# Patient Record
Sex: Female | Born: 2000 | Hispanic: No | Marital: Married | Smoking: Never smoker
Health system: Southern US, Community
[De-identification: ages and names within clinical notes are randomized; demographics above are authoritative.]

## PROBLEM LIST (undated history)

## (undated) DIAGNOSIS — N393 Stress incontinence (female) (male): Secondary | ICD-10-CM

## (undated) DIAGNOSIS — R519 Headache, unspecified: Secondary | ICD-10-CM

## (undated) DIAGNOSIS — J329 Chronic sinusitis, unspecified: Secondary | ICD-10-CM

## (undated) DIAGNOSIS — L821 Other seborrheic keratosis: Secondary | ICD-10-CM

## (undated) DIAGNOSIS — G8929 Other chronic pain: Secondary | ICD-10-CM

## (undated) DIAGNOSIS — L309 Dermatitis, unspecified: Secondary | ICD-10-CM

## (undated) DIAGNOSIS — R45851 Suicidal ideations: Secondary | ICD-10-CM

## (undated) DIAGNOSIS — F32A Depression, unspecified: Secondary | ICD-10-CM

## (undated) HISTORY — DX: Stress incontinence (female) (male): N39.3

## (undated) HISTORY — DX: Other seborrheic keratosis: L82.1

## (undated) HISTORY — DX: Depression, unspecified: F32.A

## (undated) HISTORY — DX: Suicidal ideations: R45.851

## (undated) HISTORY — DX: Other chronic pain: G89.29

## (undated) HISTORY — PX: NO PAST SURGERIES: SHX2092

## (undated) HISTORY — DX: Headache, unspecified: R51.9

---

## 2020-03-24 ENCOUNTER — Inpatient Hospital Stay (HOSPITAL_COMMUNITY): Payer: Medicaid Other

## 2020-03-24 ENCOUNTER — Inpatient Hospital Stay (HOSPITAL_COMMUNITY)
Admission: EM | Admit: 2020-03-24 | Discharge: 2020-03-24 | Disposition: A | Discharge status: Home / Self Care | Payer: Medicaid Other | Attending: Family Medicine | Admitting: Family Medicine

## 2020-03-24 ENCOUNTER — Other Ambulatory Visit: Payer: Self-pay

## 2020-03-24 ENCOUNTER — Encounter (HOSPITAL_COMMUNITY): Payer: Self-pay | Admitting: Family Medicine

## 2020-03-24 DIAGNOSIS — R102 Pelvic and perineal pain: Secondary | ICD-10-CM | POA: Insufficient documentation

## 2020-03-24 DIAGNOSIS — O26899 Other specified pregnancy related conditions, unspecified trimester: Secondary | ICD-10-CM

## 2020-03-24 DIAGNOSIS — O26891 Other specified pregnancy related conditions, first trimester: Secondary | ICD-10-CM

## 2020-03-24 DIAGNOSIS — Z3A01 Less than 8 weeks gestation of pregnancy: Secondary | ICD-10-CM

## 2020-03-24 DIAGNOSIS — O4691 Antepartum hemorrhage, unspecified, first trimester: Secondary | ICD-10-CM

## 2020-03-24 DIAGNOSIS — O3680X0 Pregnancy with inconclusive fetal viability, not applicable or unspecified: Secondary | ICD-10-CM

## 2020-03-24 DIAGNOSIS — O209 Hemorrhage in early pregnancy, unspecified: Secondary | ICD-10-CM | POA: Insufficient documentation

## 2020-03-24 HISTORY — DX: Chronic sinusitis, unspecified: J32.9

## 2020-03-24 HISTORY — DX: Dermatitis, unspecified: L30.9

## 2020-03-24 LAB — CBC
HCT: 40 % (ref 36.0–46.0)
Hemoglobin: 13.2 g/dL (ref 12.0–15.0)
MCH: 28 pg (ref 26.0–34.0)
MCHC: 33 g/dL (ref 30.0–36.0)
MCV: 84.7 fL (ref 80.0–100.0)
Platelets: 313 10*3/uL (ref 150–400)
RBC: 4.72 MIL/uL (ref 3.87–5.11)
RDW: 12.9 % (ref 11.5–15.5)
WBC: 5.8 10*3/uL (ref 4.0–10.5)
nRBC: 0 % (ref 0.0–0.2)

## 2020-03-24 LAB — URINALYSIS, ROUTINE W REFLEX MICROSCOPIC
Bilirubin Urine: NEGATIVE
Glucose, UA: NEGATIVE mg/dL
Ketones, ur: NEGATIVE mg/dL
Nitrite: NEGATIVE
Protein, ur: 100 mg/dL — AB
RBC / HPF: >50 RBC/hpf — ABNORMAL HIGH (ref 0–5)
Specific Gravity, Urine: 1.021 (ref 1.005–1.030)
pH: 6 (ref 5.0–8.0)

## 2020-03-24 LAB — WET PREP, GENITAL
Clue Cells Wet Prep HPF POC: NONE SEEN
Sperm: NONE SEEN
Trich, Wet Prep: NONE SEEN
Yeast Wet Prep HPF POC: NONE SEEN

## 2020-03-24 LAB — HCG, QUANTITATIVE, PREGNANCY: hCG, Beta Chain, Quant, S: 17870 m[IU]/mL — ABNORMAL HIGH (ref <5)

## 2020-03-24 LAB — ABO/RH: ABO/RH(D): O POS

## 2020-03-24 LAB — POCT PREGNANCY, URINE: Preg Test, Ur: POSITIVE — AB

## 2020-03-24 LAB — HIV ANTIBODY (ROUTINE TESTING W REFLEX): HIV Screen 4th Generation wRfx: NONREACTIVE

## 2020-03-24 NOTE — ED Notes (Signed)
MAU called and PA notified to assess patient.

## 2020-03-24 NOTE — ED Triage Notes (Signed)
Pt came in POV with c/o of cramping and vaginal bleeding. Pt is [redacted]week pregnant.

## 2020-03-24 NOTE — MAU Note (Signed)
Patient unable to void at this time for UPT.  Patient states she will alert staff when able to leave sample.  Labs drawn by phlebotomy.

## 2020-03-24 NOTE — Discharge Instructions (Signed)
Threatened Miscarriage  A threatened miscarriage occurs when a woman has vaginal bleeding during the first 20 weeks of pregnancy but the pregnancy has not ended. If you have vaginal bleeding during this time, your health care provider will do tests to make sure you are still pregnant. If the tests show that you are still pregnant and that the developing baby (fetus) inside your uterus is still growing, your condition is considered a threatened miscarriage. A threatened miscarriage does not mean your pregnancy will end, but it does increase the risk of losing your pregnancy (complete miscarriage). What are the causes? The cause of this condition is usually not known. For women who go on to have a complete miscarriage, the most common cause is an abnormal number of chromosomes in the developing baby. Chromosomes are the structures inside cells that hold all of a person's genetic material. What increases the risk? The following lifestyle factors may increase your risk of a miscarriage in early pregnancy:  Smoking.  Drinking excessive amounts of alcohol or caffeine.  Recreational drug use. The following preexisting health conditions may increase your risk of a miscarriage in early pregnancy:  Polycystic ovary syndrome.  Uterine fibroids.  Infections.  Diabetes mellitus. What are the signs or symptoms? Symptoms of this condition include:  Vaginal bleeding.  Mild abdominal pain or cramps. How is this diagnosed? If you have bleeding with or without abdominal pain before 20 weeks of pregnancy, your health care provider will do tests to check whether you are still pregnant. These will include:  Ultrasound. This test uses sound waves to create images of the inside of your uterus. This allows your health care provider to look at your developing baby and other structures, such as your placenta.  Pelvic exam. This is an internal exam of your vagina and cervix.  Measurement of your baby's heart  rate.  Laboratory tests such as blood tests, urine tests, or swabs for infection You may be diagnosed with a threatened miscarriage if:  Ultrasound testing shows that you are still pregnant.  Your baby's heart rate is strong.  A pelvic exam shows that the opening between your uterus and your vagina (cervix) is closed.  Blood tests confirm that you are still pregnant. How is this treated? No treatments have been shown to prevent a threatened miscarriage from going on to a complete miscarriage. However, the right home care is important. Follow these instructions at home:  Get plenty of rest.  Do not have sex or use tampons if you have vaginal bleeding.  Do not douche.  Do not smoke or use recreational drugs.  Do not drink alcohol.  Avoid caffeine.  Keep all follow-up prenatal visits as told by your health care provider. This is important. Contact a health care provider if:  You have light vaginal bleeding or spotting while pregnant.  You have abdominal pain or cramping.  You have a fever. Get help right away if:  You have heavy vaginal bleeding.  You have blood clots coming from your vagina.  You pass tissue from your vagina.  You leak fluid, or you have a gush of fluid from your vagina.  You have severe low back pain or abdominal cramps.  You have fever, chills, and severe abdominal pain. Summary  A threatened miscarriage occurs when a woman has vaginal bleeding during the first 20 weeks of pregnancy but the pregnancy has not ended.  The cause of a threatened miscarriage is usually not known.  Symptoms of this condition may   include vaginal bleeding and mild abdominal pain or cramps.  No treatments have been shown to prevent a threatened miscarriage from going on to a complete miscarriage.  Keep all follow-up prenatal visits as told by your health care provider. This is important. This information is not intended to replace advice given to you by your health  care provider. Make sure you discuss any questions you have with your health care provider. Document Revised: 01/18/2018 Document Reviewed: 03/10/2017 Elsevier Patient Education  2020 Elsevier Inc.  

## 2020-03-24 NOTE — MAU Provider Note (Signed)
Chief Complaint: Abdominal Pain and Vaginal Bleeding   First Provider Initiated Contact with Patient 03/24/20 2153        SUBJECTIVE HPI: Gloria Orr is a 19 y.o. G1P0 at [redacted]w[redacted]d by LMP who presents to maternity admissions reporting vaginal bleeding and pelvic pain. Bleeding was light over the past week and was heavy this am (now lighter).   [facility]  . She denies vaginal bleeding, vaginal itching/burning, urinary symptoms, h/a, dizziness, n/v, or fever/chills.    Abdominal Pain This is a new problem. The current episode started in the past 7 days. The onset quality is gradual. The problem occurs intermittently. The problem has been unchanged. The pain is located in the suprapubic region, LLQ and RLQ. The pain is mild. The quality of the pain is cramping. The abdominal pain does not radiate. Pertinent negatives include no constipation, diarrhea, dysuria, fever, frequency, myalgias, nausea or vomiting. Nothing aggravates the pain. The pain is relieved by nothing. She has tried nothing for the symptoms.  Vaginal Bleeding The patient's primary symptoms include pelvic pain and vaginal bleeding. The patient's pertinent negatives include no genital itching, genital lesions or genital odor. This is a new problem. The current episode started in the past 7 days. The problem occurs intermittently. The problem has been unchanged. The pain is mild. She is pregnant. Associated symptoms include abdominal pain. Pertinent negatives include no back pain, constipation, diarrhea, dysuria, fever, frequency, nausea or vomiting. The vaginal discharge was bloody. The vaginal bleeding is lighter than menses. She has not been passing clots. She has not been passing tissue. Nothing aggravates the symptoms. She has tried nothing for the symptoms.   RN Note: Patient sent from the ED for reports of pregnancy with vaginal bleeding and abdominal pain.  She states the pain started first then  started having some bleeding that was light at first but has since become heavier.  No clots.  First PNA scheduled for Thursday  Past Medical History:  Diagnosis Date  . Eczema   . Sinusitis    Past Surgical History:  Procedure Laterality Date  . NO PAST SURGERIES     Social History   Socioeconomic History  . Marital status: Married    Spouse name: Not on file  . Number of children: Not on file  . Years of education: Not on file  . Highest education level: Not on file  Occupational History  . Not on file  Tobacco Use  . Smoking status: Not on file  Substance and Sexual Activity  . Alcohol use: Not on file  . Drug use: Not on file  . Sexual activity: Yes  Other Topics Concern  . Not on file  Social History Narrative  . Not on file   Social Determinants of Health   Financial Resource Strain:   . Difficulty of Paying Living Expenses:   Food Insecurity:   . Worried About Programme researcher, broadcasting/film/video in the Last Year:   . Barista in the Last Year:   Transportation Needs:   . Freight forwarder (Medical):   Marland Kitchen Lack of Transportation (Non-Medical):   Physical Activity:   . Days of Exercise per Week:   . Minutes of Exercise per Session:   Stress:   . Feeling of Stress :   Social Connections:   . Frequency of Communication with Friends and Family:   . Frequency of Social Gatherings with Friends and Family:   . Attends Religious  Services:   . Active Member of Clubs or Organizations:   . Attends Archivist Meetings:   Marland Kitchen Marital Status:   Intimate Partner Violence:   . Fear of Current or Ex-Partner:   . Emotionally Abused:   Marland Kitchen Physically Abused:   . Sexually Abused:    No current facility-administered medications on file prior to encounter.   No current outpatient medications on file prior to encounter.   No Known Allergies  I have reviewed patient's Past Medical Hx, Surgical Hx, Family Hx, Social Hx, medications and allergies.   ROS:  Review of  Systems  Constitutional: Negative for fever.  Gastrointestinal: Positive for abdominal pain. Negative for constipation, diarrhea, nausea and vomiting.  Genitourinary: Positive for pelvic pain and vaginal bleeding. Negative for dysuria and frequency.  Musculoskeletal: Negative for back pain and myalgias.   Review of Systems  Other systems negative   Physical Exam  Physical Exam Patient Vitals for the past 24 hrs:  BP Temp Temp src Pulse Resp SpO2 Height Weight  03/24/20 2120 125/77 98.4 F (36.9 C) - 83 17 - - -  03/24/20 2058 - - - - - - - 55.8 kg  03/24/20 1958 - - - - - - 5\' 2"  (1.575 m) 55 kg  03/24/20 1955 (!) 142/71 98.3 F (36.8 C) Oral 74 16 100 % - -   Constitutional: Well-developed, well-nourished female in no acute distress.  Cardiovascular: normal rate Respiratory: normal effort GI: Abd soft, non-tender. Pos BS x 4 MS: Extremities nontender, no edema, normal ROM Neurologic: Alert and oriented x 4.  GU: Neg CVAT.  PELVIC EXAM: Cervix pink, visually closed, without lesion, scant red discharge, vaginal walls and external genitalia normal  LAB RESULTS Results for orders placed or performed during the hospital encounter of 03/24/20 (from the past 24 hour(s))  CBC     Status: None   Collection Time: 03/24/20  8:53 PM  Result Value Ref Range   WBC 5.8 4.0 - 10.5 K/uL   RBC 4.72 3.87 - 5.11 MIL/uL   Hemoglobin 13.2 12.0 - 15.0 g/dL   HCT 40.0 36.0 - 46.0 %   MCV 84.7 80.0 - 100.0 fL   MCH 28.0 26.0 - 34.0 pg   MCHC 33.0 30.0 - 36.0 g/dL   RDW 12.9 11.5 - 15.5 %   Platelets 313 150 - 400 K/uL   nRBC 0.0 0.0 - 0.2 %  ABO/Rh     Status: None   Collection Time: 03/24/20  8:53 PM  Result Value Ref Range   ABO/RH(D) O POS    No rh immune globuloin      NOT A RH IMMUNE GLOBULIN CANDIDATE, PT RH POSITIVE Performed at [facility], [place] [address], [place], [place] [address]   hCG, quantitative, pregnancy     Status: Abnormal   Collection Time: 03/24/20  8:53 PM   Result Value Ref Range   hCG, Beta Chain, Quant, S 17,870 (H) <5 mIU/mL  HIV Antibody (routine testing w rflx)     Status: None   Collection Time: 03/24/20  8:53 PM  Result Value Ref Range   HIV Screen 4th Generation wRfx NON REACTIVE NON REACTIVE  Pregnancy, urine POC     Status: Abnormal   Collection Time: 03/24/20  9:11 PM  Result Value Ref Range   Preg Test, Ur POSITIVE (A) NEGATIVE  Urinalysis, Routine w reflex microscopic     Status: Abnormal   Collection Time: 03/24/20  9:12 PM  Result Value Ref Range   Color, Urine YELLOW YELLOW   APPearance CLOUDY (A) CLEAR   Specific Gravity, Urine 1.021 1.005 - 1.030   pH 6.0 5.0 - 8.0   Glucose, UA NEGATIVE NEGATIVE mg/dL   Hgb urine dipstick LARGE (A) NEGATIVE   Bilirubin Urine NEGATIVE NEGATIVE   Ketones, ur NEGATIVE NEGATIVE mg/dL   Protein, ur 505 (A) NEGATIVE mg/dL   Nitrite NEGATIVE NEGATIVE   Leukocytes,Ua SMALL (A) NEGATIVE   RBC / HPF >50 (H) 0 - 5 RBC/hpf   WBC, UA 11-20 0 - 5 WBC/hpf   Bacteria, UA FEW (A) NONE SEEN   Squamous Epithelial / LPF 6-10 0 - 5   WBC Clumps PRESENT    Mucus PRESENT   Wet prep, genital     Status: Abnormal   Collection Time: 03/24/20 10:15 PM  Result Value Ref Range   Yeast Wet Prep HPF POC NONE SEEN NONE SEEN   Trich, Wet Prep NONE SEEN NONE SEEN   Clue Cells Wet Prep HPF POC NONE SEEN NONE SEEN   WBC, Wet Prep HPF POC MANY (A) NONE SEEN   Sperm NONE SEEN     --/--/O POS (03/30 2053)  IMAGING US OB Comp Less 14 Wks  Result Date: 03/24/2020 CLINICAL DATA:  Pelvic pain and vaginal bleeding with positive pregnancy test EXAM: OBSTETRIC <14 WK [place] AND TRANSVAGINAL OB US TECHNIQUE: Both transabdominal and transvaginal ultrasound examinations were performed for complete evaluation of the gestation as well as the maternal uterus, adnexal regions, and pelvic cul-de-sac. Transvaginal technique was performed to assess early pregnancy. COMPARISON:  None. FINDINGS: Intrauterine gestational sac:  Present Yolk sac:  Present Embryo:  Absent MSD: 11.6 mm   5 w   6 d Subchorionic hemorrhage:  None visualized. Maternal uterus/adnexae: Within normal limits. Minimal free fluid is noted in the pelvic cul-de-sac. IMPRESSION: Probable early intrauterine gestational sac with yolk sac, but no fetal pole, or cardiac activity yet visualized. Recommend follow-up quantitative B-HCG levels and follow-up US in 14 days to assess viability. This recommendation follows SRU consensus guidelines: Diagnostic Criteria for Nonviable Pregnancy Early in the First Trimester. Malva Limes Med 2013; 397:6734-19. No acute abnormality noted. Electronically Signed   By: Alcide Clever M.D.   On: 03/24/2020 22:40   US OB Transvaginal  Result Date: 03/24/2020 CLINICAL DATA:  Pelvic pain and vaginal bleeding with positive pregnancy test EXAM: OBSTETRIC <14 WK [place] AND TRANSVAGINAL OB US TECHNIQUE: Both transabdominal and transvaginal ultrasound examinations were performed for complete evaluation of the gestation as well as the maternal uterus, adnexal regions, and pelvic cul-de-sac. Transvaginal technique was performed to assess early pregnancy. COMPARISON:  None. FINDINGS: Intrauterine gestational sac: Present Yolk sac:  Present Embryo:  Absent MSD: 11.6 mm   5 w   6 d Subchorionic hemorrhage:  None visualized. Maternal uterus/adnexae: Within normal limits. Minimal free fluid is noted in the pelvic cul-de-sac. IMPRESSION: Probable early intrauterine gestational sac with yolk sac, but no fetal pole, or cardiac activity yet visualized. Recommend follow-up quantitative B-HCG levels and follow-up US in 14 days to assess viability. This recommendation follows SRU consensus guidelines: Diagnostic Criteria for Nonviable Pregnancy Early in the First Trimester. Malva Limes Med 2013; 379:0240-97. No acute abnormality noted. Electronically Signed   By: Alcide Clever M.D.   On: 03/24/2020 22:40   MAU Management/MDM: Ordered usual first trimester r/o ectopic  labs.   Pelvic exam and cultures done Will check baseline Ultrasound to rule out ectopic.  This bleeding/pain can represent a normal pregnancy with bleeding, spontaneous abortion or even an ectopic which can be life-threatening.  The process as listed above helps to determine which of these is present.  Reviewed findings effectively rule out ectopic but we cannot speak to viability just yet Recommend repeat US in 7-10 days SAB precautions  ASSESSMENT Pregnancy at [redacted]w[redacted]d by LMP Pelvic pain in pregnancy Bleeding in pregnancy  PLAN Discharge home Will repeat  Ultrasound in about 7-10 days  Ectopic precautions  Pt stable at time of discharge. Encouraged to return here or to other Urgent Care/ED if she develops worsening of symptoms, increase in pain, fever, or other concerning symptoms.    Wynelle Bourgeois CNM, MSN Certified Nurse-Midwife 03/24/2020  9:53 PM

## 2020-03-24 NOTE — MAU Note (Signed)
Patient sent from the ED for reports of pregnancy with vaginal bleeding and abdominal pain.  She states the pain started first then started having some bleeding that was light at first but has since become heavier.  No clots.  First PNA scheduled for Thursday.

## 2020-03-26 LAB — GC/CHLAMYDIA PROBE AMP (~~LOC~~) NOT AT ARMC
Chlamydia: NEGATIVE
Comment: NEGATIVE
Comment: NORMAL
Neisseria Gonorrhea: NEGATIVE

## 2020-03-30 ENCOUNTER — Ambulatory Visit (INDEPENDENT_AMBULATORY_CARE_PROVIDER_SITE_OTHER): Payer: Self-pay

## 2020-03-30 ENCOUNTER — Other Ambulatory Visit: Payer: Self-pay

## 2020-03-30 ENCOUNTER — Ambulatory Visit (HOSPITAL_COMMUNITY)
Admission: RE | Admit: 2020-03-30 | Discharge: 2020-03-30 | Disposition: A | Discharge status: Home / Self Care | Payer: Medicaid Other | Source: Ambulatory Visit | Attending: Advanced Practice Midwife | Admitting: Advanced Practice Midwife

## 2020-03-30 DIAGNOSIS — O3680X0 Pregnancy with inconclusive fetal viability, not applicable or unspecified: Secondary | ICD-10-CM | POA: Insufficient documentation

## 2020-03-30 DIAGNOSIS — O209 Hemorrhage in early pregnancy, unspecified: Secondary | ICD-10-CM | POA: Diagnosis present

## 2020-03-30 DIAGNOSIS — R102 Pelvic and perineal pain: Secondary | ICD-10-CM | POA: Insufficient documentation

## 2020-03-30 DIAGNOSIS — O368390 Maternal care for abnormalities of the fetal heart rate or rhythm, unspecified trimester, not applicable or unspecified: Secondary | ICD-10-CM

## 2020-03-30 DIAGNOSIS — O26899 Other specified pregnancy related conditions, unspecified trimester: Secondary | ICD-10-CM | POA: Insufficient documentation

## 2020-03-30 NOTE — Progress Notes (Signed)
Pt here today for OB US results r/t pelvic pain and vaginal bleeding.  Notified Dr. Alysia Penna pt's [place] results which shows fetal bradycardia.  Provider recommendation to schedule f/u OB US in 10-14 days to f/u FHR.  Notified pt provider's recommendation and [place] appt 04/13/20 @ 0900.  I also advised pt that she will come over to the office for results as she did today for results.  Pt verbalized understanding with no further questions.   Addison Naegeli, RN 03/30/20

## 2020-03-31 NOTE — Progress Notes (Signed)
Agree with A & P. 

## 2020-04-05 ENCOUNTER — Other Ambulatory Visit: Payer: Self-pay

## 2020-04-05 ENCOUNTER — Inpatient Hospital Stay (HOSPITAL_COMMUNITY)
Admission: AD | Admit: 2020-04-05 | Discharge: 2020-04-05 | Disposition: A | Discharge status: Home / Self Care | Payer: Medicaid Other | Attending: Obstetrics & Gynecology | Admitting: Obstetrics & Gynecology

## 2020-04-05 ENCOUNTER — Ambulatory Visit (HOSPITAL_COMMUNITY)
Admission: EM | Admit: 2020-04-05 | Discharge: 2020-04-05 | Disposition: A | Discharge status: Home / Self Care | Payer: Self-pay

## 2020-04-05 ENCOUNTER — Encounter (HOSPITAL_COMMUNITY): Payer: Self-pay | Admitting: Obstetrics & Gynecology

## 2020-04-05 ENCOUNTER — Inpatient Hospital Stay (HOSPITAL_COMMUNITY): Payer: Medicaid Other

## 2020-04-05 DIAGNOSIS — N939 Abnormal uterine and vaginal bleeding, unspecified: Secondary | ICD-10-CM

## 2020-04-05 DIAGNOSIS — O208 Other hemorrhage in early pregnancy: Secondary | ICD-10-CM | POA: Insufficient documentation

## 2020-04-05 DIAGNOSIS — O468X1 Other antepartum hemorrhage, first trimester: Secondary | ICD-10-CM

## 2020-04-05 DIAGNOSIS — O210 Mild hyperemesis gravidarum: Secondary | ICD-10-CM | POA: Diagnosis present

## 2020-04-05 DIAGNOSIS — Z3A01 Less than 8 weeks gestation of pregnancy: Secondary | ICD-10-CM | POA: Insufficient documentation

## 2020-04-05 DIAGNOSIS — O418X10 Other specified disorders of amniotic fluid and membranes, first trimester, not applicable or unspecified: Secondary | ICD-10-CM

## 2020-04-05 LAB — URINALYSIS, ROUTINE W REFLEX MICROSCOPIC
Bacteria, UA: NONE SEEN
Bilirubin Urine: NEGATIVE
Glucose, UA: NEGATIVE mg/dL
Hgb urine dipstick: NEGATIVE
Ketones, ur: 20 mg/dL — AB
Leukocytes,Ua: NEGATIVE
Nitrite: NEGATIVE
Protein, ur: 100 mg/dL — AB
Specific Gravity, Urine: 1.026 (ref 1.005–1.030)
pH: 8 (ref 5.0–8.0)

## 2020-04-05 LAB — CBC
HCT: 40.3 % (ref 36.0–46.0)
Hemoglobin: 13.7 g/dL (ref 12.0–15.0)
MCH: 28.4 pg (ref 26.0–34.0)
MCHC: 34 g/dL (ref 30.0–36.0)
MCV: 83.6 fL (ref 80.0–100.0)
Platelets: 272 10*3/uL (ref 150–400)
RBC: 4.82 MIL/uL (ref 3.87–5.11)
RDW: 12.8 % (ref 11.5–15.5)
WBC: 4.9 10*3/uL (ref 4.0–10.5)
nRBC: 0 % (ref 0.0–0.2)

## 2020-04-05 LAB — WET PREP, GENITAL
Clue Cells Wet Prep HPF POC: NONE SEEN
Sperm: NONE SEEN
Trich, Wet Prep: NONE SEEN
Yeast Wet Prep HPF POC: NONE SEEN

## 2020-04-05 MED ORDER — METOCLOPRAMIDE HCL 10 MG PO TABS: 10.0000 mg | ORAL_TABLET | Freq: Four times a day (QID) | ORAL | 1 refills | Status: DC

## 2020-04-05 MED ORDER — METOCLOPRAMIDE HCL 10 MG PO TABS
10.0000 mg | ORAL_TABLET | Freq: Once | ORAL | Status: AC
  Administered 2020-04-05: 10 mg via ORAL
  Filled 2020-04-05: qty 1

## 2020-04-05 NOTE — Discharge Instructions (Signed)
Morning Sickness ° °Morning sickness is when you feel sick to your stomach (nauseous) during pregnancy. You may feel sick to your stomach and throw up (vomit). You may feel sick in the morning, but you can feel this way at any time of day. Some women feel very sick to their stomach and cannot stop throwing up (hyperemesis gravidarum). °Follow these instructions at home: °Medicines °· Take over-the-counter and prescription medicines only as told by your doctor. Do not take any medicines until you talk with your doctor about them first. °· Taking multivitamins before getting pregnant can stop or lessen the harshness of morning sickness. °Eating and drinking °· Eat dry toast or crackers before getting out of bed. °· Eat 5 or 6 small meals a day. °· Eat dry and bland foods like rice and baked potatoes. °· Do not eat greasy, fatty, or spicy foods. °· Have someone cook for you if the smell of food causes you to feel sick or throw up. °· If you feel sick to your stomach after taking prenatal vitamins, take them at night or with a snack. °· Eat protein when you need a snack. Nuts, yogurt, and cheese are good choices. °· Drink fluids throughout the day. °· Try ginger ale made with real ginger, ginger tea made from fresh grated ginger, or ginger candies. °General instructions °· Do not use any products that have nicotine or tobacco in them, such as cigarettes and e-cigarettes. If you need help quitting, ask your doctor. °· Use an air purifier to keep the air in your house free of smells. °· Get lots of fresh air. °· Try to avoid smells that make you feel sick. °· Try: °? Wearing a bracelet that is used for seasickness (acupressure wristband). °? Going to a doctor who puts thin needles into certain body points (acupuncture) to improve how you feel. °Contact a doctor if: °· You need medicine to feel better. °· You feel dizzy or light-headed. °· You are losing weight. °Get help right away if: °· You feel very sick to your  stomach and cannot stop throwing up. °· You pass out (faint). °· You have very bad pain in your belly. °Summary °· Morning sickness is when you feel sick to your stomach (nauseous) during pregnancy. °· You may feel sick in the morning, but you can feel this way at any time of day. °· Making some changes to what you eat may help your symptoms go away. °This information is not intended to replace advice given to you by your health care provider. Make sure you discuss any questions you have with your health care provider. °Document Revised: 11/24/2017 Document Reviewed: 01/12/2017 °Elsevier Patient Education © 2020 Elsevier Inc. ° °

## 2020-04-05 NOTE — ED Triage Notes (Signed)
Pt presents with continued vaginal bleeding. States she noticed clots this morning. Patient reports feeling tired, nauseous and not being able to keep anything down. Pt is concerned for being dehydrated. Pt has positive pregnancy test in Epic and reports being [redacted] weeks pregnant. Pt states she has noticed clots in the bleeding. Pt is being discharged to MAU with partner due to complaints and positive pregnancy test. This RN spoke with Dr. Delton See.

## 2020-04-05 NOTE — MAU Provider Note (Addendum)
Patient Gloria Orr is a 19 y.o. G1P0  at [redacted]w[redacted]d here with complaints of on-going vaginal bleeding and on-going nausea and vomiting. She also endorses some abdominal pain. She denies fever, difficulty breathing, SOB. She denies any relevant health history.   History     CSN: [id number]  Arrival date and time: 04/05/20 1446   None     Chief Complaint  Patient presents with  . Nausea  . Emesis  . Abdominal Pain  . Vaginal Bleeding   Vaginal Bleeding The patient's primary symptoms include vaginal bleeding. This is a recurrent problem. The current episode started 1 to 4 weeks ago. The problem occurs intermittently. The problem has been unchanged. Associated symptoms include abdominal pain, diarrhea and vomiting. Pertinent negatives include no constipation, dysuria, fever or urgency. The vaginal discharge was bloody. The vaginal bleeding is lighter than menses. She has been passing clots (little clots).  Emesis  This is a new problem. The current episode started 1 to 4 weeks ago. The problem occurs 2 to 4 times per day. There has been no fever. Associated symptoms include abdominal pain and diarrhea. Pertinent negatives include no fever.  Patient has been trying to drink milk, eat vegetables, salads, meat.   OB History    Gravida  1   Para      Term      Preterm      AB      Living        SAB      TAB      Ectopic      Multiple      Live Births              Past Medical History:  Diagnosis Date  . Eczema   . Sinusitis     Past Surgical History:  Procedure Laterality Date  . NO PAST SURGERIES      Family History  Problem Relation Age of Onset  . Asthma Mother   . Hypertension Father   . Diabetes Father   . Heart disease Sister   . Asthma Sister   . Asthma Brother     Social History   Tobacco Use  . Smoking status: Never Smoker  . Smokeless tobacco: Never Used  Substance Use Topics  . Alcohol use: Not Currently  . Drug use: Never     Allergies: No Known Allergies  No medications prior to admission.    Review of Systems  Constitutional: Negative for fever.  HENT: Negative.   Gastrointestinal: Positive for abdominal pain, diarrhea and vomiting. Negative for constipation.  Genitourinary: Positive for vaginal bleeding. Negative for dysuria and urgency.  Musculoskeletal: Negative.   Neurological: Negative.   Psychiatric/Behavioral: Negative.    Physical Exam   Blood pressure (!) 107/59, pulse 62, temperature 99 F (37.2 C), temperature source Oral, resp. rate 16, last menstrual period 02/12/2020, SpO2 100 %.  Physical Exam  Constitutional: She appears well-developed.  HENT:  Head: Normocephalic.  GI: Soft. Bowel sounds are normal.  Genitourinary:    Genitourinary Comments: NEFG; dark brown blood in the vagina, no clots or tissue. Cervix is closed, long and thick. No CMT, suprapubic or adnexal tenderness.    Musculoskeletal:        General: Normal range of motion.     Cervical back: Normal range of motion.  Neurological: She is alert.  Skin: Skin is warm.    MAU Course  Procedures  MDM -US shows little change from 6 days ago, still with  cardiac activity.  -patient had reglan and feels better, tolerated ginger ale.  -wet prep negative -UA negative for signs of infection; slight dehydration.   Assessment and Plan   1. Subchorionic hemorrhage of placenta in first trimester, single or unspecified fetus   2. Vaginal bleeding    2. Patient stable for discharge; GoodRX given for Reglan in order to minimize cost until her medicaid comes through.   3. Keep [place] appt on 04-13-2020; explained that viability is uncertain at this point. Counseled patient on options in case she does not have viable pregnancy on the 19th. Patient and partner verbalized understanding.   Charlesetta Garibaldi Jerrine Urschel 04/05/2020, 4:49 PM

## 2020-04-05 NOTE — MAU Note (Signed)
Gloria Orr is a 19 y.o. at [redacted]w[redacted]d here in MAU reporting: ongoing vaginal bleeding, today she saw some blood clots, 1 was the size of a quarter and the others were smaller. Lots of nausea and vomiting, emesis 3 times in the past 24 hours. Having some cramping.  Onset of complaint: ongoing  Pain score: 3/10  Vitals:   04/05/20 1501  BP: 117/64  Pulse: 64  Resp: 16  Temp: 99 F (37.2 C)  SpO2: 100%     Lab orders placed from triage: UA

## 2020-04-06 LAB — GC/CHLAMYDIA PROBE AMP (~~LOC~~) NOT AT ARMC
Chlamydia: NEGATIVE
Comment: NEGATIVE
Comment: NORMAL
Neisseria Gonorrhea: NEGATIVE

## 2020-04-13 ENCOUNTER — Other Ambulatory Visit: Payer: Self-pay

## 2020-04-13 ENCOUNTER — Ambulatory Visit (HOSPITAL_COMMUNITY)
Admission: RE | Admit: 2020-04-13 | Discharge: 2020-04-13 | Disposition: A | Discharge status: Home / Self Care | Payer: Medicaid Other | Source: Ambulatory Visit | Attending: Obstetrics and Gynecology | Admitting: Obstetrics and Gynecology

## 2020-04-13 ENCOUNTER — Encounter: Payer: Self-pay | Admitting: Obstetrics & Gynecology

## 2020-04-13 ENCOUNTER — Ambulatory Visit (INDEPENDENT_AMBULATORY_CARE_PROVIDER_SITE_OTHER): Payer: Self-pay | Admitting: Obstetrics & Gynecology

## 2020-04-13 DIAGNOSIS — O368390 Maternal care for abnormalities of the fetal heart rate or rhythm, unspecified trimester, not applicable or unspecified: Secondary | ICD-10-CM | POA: Insufficient documentation

## 2020-04-13 DIAGNOSIS — O021 Missed abortion: Secondary | ICD-10-CM

## 2020-04-13 NOTE — Patient Instructions (Addendum)
  Expectant management vs Misoprostol vs Surgical for early intrauterine pregnancy demise   If patient wants expectant management, can given up to 4 weeks.  If she wants misoprostol administration; the protocol/checklist is below:  Early Intrauterine Pregnancy Failure Treatment with Misoprostol  ___  Documented intrauterine pregnancy failure less than or equal to [redacted] weeks gestation  ___  No serious current illness  ___  Baseline Hgb greater than or equal to 10g/dl (was 17.6 on 1/60/73)  ___  Patient has easily accessible transportation to the hospital  ___  Clear preference  ___  Practitioner/physician deems patient reliable  ___  Counseling by practitioner or physician  ___  Rho-Gam given by RN if indicated (she is O pos, not needed)  ___ Medication dispensed   ___   Misoprostol 800 mcg  __   Intravaginally by patient at home         __   Intravaginally by RN in MAU/Clinic        __   Buccally by patient at home      ___  Ibuprofen 600 mg 1 tablet by mouth every 6 hours as needed #30  ___  Hydrocodone/acetaminophen 5/325 mg by mouth every 4 to 6 hours as needed #15  ___  Phenergan 12.5 mg by mouth every 4 hours as needed for nausea #30  Risks and benefits of medical management were carefully explained, including a success rate of 80-90%, the need for another person to be at home with her, and to call/come in if she had heavy bleeding, dizziness, or severe pain not relieved by medication.  She will follow up in one week after misoprostol administration; if there has been no passage of pregnancy, will consider repeat misoprostol vs D&E.  Patient should again be advised to call or come in for evaluation for any concerning symptoms; bleeding precautions should be strictly emphasized.   If patient changes her mind and opts for D&E, please call the Attending on call and this will be scheduled for her sometime in the subsequent few days depending on surgeon and OR availability.  Risks of surgery including bleeding, infection, injury to surrounding organs, need for additional procedures, possibility of intrauterine scarring which may impair future fertility, risk of retained products which may require further management and other postoperative/anesthesia complications will be explained to patient.

## 2020-04-13 NOTE — Progress Notes (Signed)
Ultrasounds Results Note  SUBJECTIVE HPI: [name] is a 19 y.o. G1P0 at [redacted]w[redacted]d by LMP who presents to the [facility] for followup ultrasound results.  Upon review of the patient's records, patient has had inconclusive ultrasounds for fetal viablity. Scan on 03/30/20 showed fetal bradycardia at 85 at [redacted]w[redacted]d, and the one on 04/05/20 could not accurately measure the FHR. Has been at same CRL/GA since first scan on 03/24/20. Repeat ultrasound was performed earlier today. The patient denies abdominal pain or vaginal bleeding.  Past Medical History:  Diagnosis Date  . Eczema   . Sinusitis    Past Surgical History:  Procedure Laterality Date  . NO PAST SURGERIES     Social History   Socioeconomic History  . Marital status: Married    Spouse name: Not on file  . Number of children: Not on file  . Years of education: Not on file  . Highest education level: Not on file  Occupational History  . Not on file  Tobacco Use  . Smoking status: Never Smoker  . Smokeless tobacco: Never Used  Substance and Sexual Activity  . Alcohol use: Not Currently  . Drug use: Never  . Sexual activity: Yes  Other Topics Concern  . Not on file  Social History Narrative  . Not on file   Social Determinants of Health   Financial Resource Strain:   . Difficulty of Paying Living Expenses:   Food Insecurity:   . Worried About Programme researcher, broadcasting/film/video in the Last Year:   . Barista in the Last Year:   Transportation Needs:   . Freight forwarder (Medical):   Marland Kitchen Lack of Transportation (Non-Medical):   Physical Activity:   . Days of Exercise per Week:   . Minutes of Exercise per Session:   Stress:   . Feeling of Stress :   Social Connections:   . Frequency of Communication with Friends and Family:   . Frequency of Social Gatherings with Friends and Family:   . Attends Religious Services:   . Active Member of Clubs or Organizations:   . Attends Banker Meetings:   Marland Kitchen  Marital Status:   Intimate Partner Violence:   . Fear of Current or Ex-Partner:   . Emotionally Abused:   Marland Kitchen Physically Abused:   . Sexually Abused:    Current Outpatient Medications on File Prior to Visit  Medication Sig Dispense Refill  . metoCLOPramide (REGLAN) 10 MG tablet Take 1 tablet (10 mg total) by mouth every 6 (six) hours. 60 tablet 1   No current facility-administered medications on file prior to visit.   No Known Allergies  I have reviewed patient's Past Medical Hx, Surgical Hx, Family Hx, Social Hx, medications and allergies.   Review of Systems Review of Systems  Constitutional: Negative for fever and chills.  Gastrointestinal: Negative for nausea, vomiting, abdominal pain, diarrhea and constipation.  Genitourinary: Negative for dysuria.  Musculoskeletal: Negative for back pain.  Neurological: Negative for dizziness and weakness.    Physical Exam  LMP 02/12/2020   GENERAL: Well-developed, well-nourished female in no acute distress.  HEENT: Normocephalic, atraumatic.   LUNGS: Effort normal ABDOMEN: soft, non-tender HEART: Regular rate  SKIN: Warm, dry and without erythema PSYCH: Normal mood and affect NEURO: Alert and oriented x 4  LAB RESULTS No results found for this or any previous visit (from the past 24 hour(s)).  IMAGING US OB Comp Less 14 Wks  Result Date: 04/05/2020  CLINICAL DATA:  Vaginal bleeding EXAM: OBSTETRIC <14 WK [place] AND TRANSVAGINAL OB US TECHNIQUE: Both transabdominal and transvaginal ultrasound examinations were performed for complete evaluation of the gestation as well as the maternal uterus, adnexal regions, and pelvic cul-de-sac. Transvaginal technique was performed to assess early pregnancy. COMPARISON:  None. FINDINGS: Intrauterine gestational sac: Single, with mildly angular margins Yolk sac:  Visualized. Embryo:  Visualized. Cardiac Activity: Visualized on cine imaging. Heart Rate: Could not be accurately measured. CRL:  3.0 mm   5 w    5 d                  [place] EDC: 12/01/2020 Subchorionic hemorrhage:  Small subchronic hemorrhage. Maternal uterus/adnexae: Bilateral ovaries are within normal limits. No free fluid. IMPRESSION: Single early live intrauterine gestational sac, measuring 5 weeks 5 days by crown-rump length. Consider follow-up pelvic ultrasound in 14 days to confirm viability, as clinically warranted. Electronically Signed   By: Charline Bills M.D.   On: 04/05/2020 16:59   US OB Comp Less 14 Wks  Result Date: 03/24/2020 CLINICAL DATA:  Pelvic pain and vaginal bleeding with positive pregnancy test EXAM: OBSTETRIC <14 WK [place] AND TRANSVAGINAL OB US TECHNIQUE: Both transabdominal and transvaginal ultrasound examinations were performed for complete evaluation of the gestation as well as the maternal uterus, adnexal regions, and pelvic cul-de-sac. Transvaginal technique was performed to assess early pregnancy. COMPARISON:  None. FINDINGS: Intrauterine gestational sac: Present Yolk sac:  Present Embryo:  Absent MSD: 11.6 mm   5 w   6 d Subchorionic hemorrhage:  None visualized. Maternal uterus/adnexae: Within normal limits. Minimal free fluid is noted in the pelvic cul-de-sac. IMPRESSION: Probable early intrauterine gestational sac with yolk sac, but no fetal pole, or cardiac activity yet visualized. Recommend follow-up quantitative B-HCG levels and follow-up US in 14 days to assess viability. This recommendation follows SRU consensus guidelines: Diagnostic Criteria for Nonviable Pregnancy Early in the First Trimester. Malva Limes Med 2013; 161:0960-45. No acute abnormality noted. Electronically Signed   By: Alcide Clever M.D.   On: 03/24/2020 22:40   US OB Transvaginal  Result Date: 04/13/2020 CLINICAL DATA:  Fetal bradycardia. EXAM: TRANSVAGINAL OB ULTRASOUND TECHNIQUE: Transvaginal ultrasound was performed for complete evaluation of the gestation as well as the maternal uterus, adnexal regions, and pelvic cul-de-sac. COMPARISON:   04/05/2020 FINDINGS: Intrauterine gestational sac: Single Yolk sac:  Yes Embryo:  Yes Cardiac Activity: No Heart Rate: 0 bpm CRL:   2.2 mm   5 w 5 d Subchorionic hemorrhage:  Small Maternal uterus/adnexae: Right ovary: Normal Left ovary: Normal containing corpus luteum Other :Retroverted uterus. Free fluid:  None IMPRESSION: 1. Single early intrauterine gestational sac containing an embryo without cardiac activity. Findings are suspicious but not yet definitive for failed pregnancy. Recommend follow-up US in 10-14 days for definitive diagnosis. This recommendation follows SRU consensus guidelines: Diagnostic Criteria for Nonviable Pregnancy Early in the First Trimester. Malva Limes Med 2013; 409:8119-14. Electronically Signed   By: Signa Kell M.D.   On: 04/13/2020 10:59   US OB Transvaginal  Result Date: 04/05/2020 CLINICAL DATA:  Vaginal bleeding EXAM: OBSTETRIC <14 WK [place] AND TRANSVAGINAL OB US TECHNIQUE: Both transabdominal and transvaginal ultrasound examinations were performed for complete evaluation of the gestation as well as the maternal uterus, adnexal regions, and pelvic cul-de-sac. Transvaginal technique was performed to assess early pregnancy. COMPARISON:  None. FINDINGS: Intrauterine gestational sac: Single, with mildly angular margins Yolk sac:  Visualized. Embryo:  Visualized. Cardiac Activity: Visualized on  cine imaging. Heart Rate: Could not be accurately measured. CRL:  3.0 mm   5 w   5 d                  [place] EDC: 12/01/2020 Subchorionic hemorrhage:  Small subchronic hemorrhage. Maternal uterus/adnexae: Bilateral ovaries are within normal limits. No free fluid. IMPRESSION: Single early live intrauterine gestational sac, measuring 5 weeks 5 days by crown-rump length. Consider follow-up pelvic ultrasound in 14 days to confirm viability, as clinically warranted. Electronically Signed   By: Charline Bills M.D.   On: 04/05/2020 16:59   US OB Transvaginal  Result Date: 03/30/2020 CLINICAL DATA:   Bleeding and pelvic pain EXAM: TRANSVAGINAL OB ULTRASOUND TECHNIQUE: Transvaginal ultrasound was performed for complete evaluation of the gestation as well as the maternal uterus, adnexal regions, and pelvic cul-de-sac. COMPARISON:  03/24/2020 FINDINGS: Intrauterine gestational sac: Present Yolk sac:  Present Embryo:  Present Cardiac Activity: Present Heart Rate: 85 bpm CRL:   3 mm   5 w 6 d                  [place] EDC: 11/24/2020 Subchorionic hemorrhage: Small 2.3 x 1.0 cm left lateral subchorionic hemorrhage is noted. Maternal uterus/adnexae: Corpus luteum cyst is noted within the left ovary. IMPRESSION: Single live intrauterine gestation at 5 weeks 6 days with positive cardiac activity. Small subchorionic hemorrhage. Electronically Signed   By: Alcide Clever M.D.   On: 03/30/2020 14:38   US OB Transvaginal  Result Date: 03/24/2020 CLINICAL DATA:  Pelvic pain and vaginal bleeding with positive pregnancy test EXAM: OBSTETRIC <14 WK [place] AND TRANSVAGINAL OB US TECHNIQUE: Both transabdominal and transvaginal ultrasound examinations were performed for complete evaluation of the gestation as well as the maternal uterus, adnexal regions, and pelvic cul-de-sac. Transvaginal technique was performed to assess early pregnancy. COMPARISON:  None. FINDINGS: Intrauterine gestational sac: Present Yolk sac:  Present Embryo:  Absent MSD: 11.6 mm   5 w   6 d Subchorionic hemorrhage:  None visualized. Maternal uterus/adnexae: Within normal limits. Minimal free fluid is noted in the pelvic cul-de-sac. IMPRESSION: Probable early intrauterine gestational sac with yolk sac, but no fetal pole, or cardiac activity yet visualized. Recommend follow-up quantitative B-HCG levels and follow-up US in 14 days to assess viability. This recommendation follows SRU consensus guidelines: Diagnostic Criteria for Nonviable Pregnancy Early in the First Trimester. Malva Limes Med 2013; [id number]:1191-47. No acute abnormality noted. Electronically Signed   By: Alcide Clever M.D.   On: 03/24/2020 22:40    ASSESSMENT 1. Missed abortion     PLAN Results discussed with patient.  Appropriate support given to her. Explained genetic etiology for early MAB, can happen in up to 15% of pregnancies.  Discussed management modalities: Expectant management vs Misoprostol vs Surgical.    If patient wants expectant management, can given up to 4 weeks.  If she wants misoprostol administration; the protocol/checklist is below: Early Intrauterine Pregnancy Failure Treatment with Misoprostol ___  Documented intrauterine pregnancy failure less than or equal to [redacted] weeks gestation (it is [redacted]w[redacted]d) ___  No serious current illness ___  Baseline Hgb greater than or equal to 10g/dl (was 82.9 on 5/62/13) ___  Patient has easily accessible transportation to the hospital ___  Clear preference ___  Practitioner/physician deems patient reliable ___  Counseling by practitioner or physician ___  Rho-Gam given by RN if indicated (she is O pos, not needed) ___  Medication dispensed ___   Misoprostol 800 mcg  __   Intravaginally by patient at home      __   Intravaginally by RN in Clinic     __   Buccally by patient at home ___  Ibuprofen 600 mg 1 tablet by mouth every 6 hours as needed #30 ___  Hydrocodone/acetaminophen 5/325 mg by mouth every 4 to 6 hours as needed #15 ___  Phenergan 12.5 mg by mouth every 4 hours as needed for nausea #30  Risks and benefits of medical management were carefully explained, including a success rate of 80-90%, the need for another person to be at home with her, and to call/come in if she had heavy bleeding, dizziness, or severe pain not relieved by medication.  She will follow up in one week after misoprostol administration; if there has been no passage of pregnancy, will consider repeat misoprostol vs D&E.  Patient should again be advised to call or come in for evaluation for any concerning symptoms; bleeding precautions should be strictly emphasized.    If patient changes her mind and opts for D&E, please call the Attending on call and this will be scheduled for her sometime in the subsequent few days depending on surgeon and OR availability. Risks of surgery including bleeding, infection, injury to surrounding organs, need for additional procedures, possibility of intrauterine scarring which may impair future fertility, risk of retained products which may require further management and other postoperative/anesthesia complications will be explained to patient.  She is unsure of what she wants to do now, will discuss with husband and let us know. Discharged to home in stable condition.  Total face-to-face time with patient: 20 minutes. Over 50% of encounter was spent on counseling and coordination of care.    Osborne Oman, MD  04/13/2020  11:25 AM

## 2020-04-15 ENCOUNTER — Other Ambulatory Visit: Payer: Self-pay

## 2020-04-15 MED ORDER — IBUPROFEN 800 MG PO TABS: 800.0000 mg | ORAL_TABLET | Freq: Three times a day (TID) | ORAL | 1 refills | Status: DC | PRN

## 2020-04-15 MED ORDER — MISOPROSTOL 200 MCG PO TABS: 200.0000 ug | ORAL_TABLET | Freq: Four times a day (QID) | ORAL | 0 refills | Status: DC

## 2020-04-15 MED ORDER — PROMETHAZINE HCL 25 MG PO TABS: 25.0000 mg | ORAL_TABLET | Freq: Four times a day (QID) | ORAL | 1 refills | Status: DC | PRN

## 2020-04-15 NOTE — Progress Notes (Signed)
Patient had conversation with dr.Anyanwu about taking the Cytotec. She sent a message stating she agrees to take them. Sending to pharmacy on file.

## 2020-04-21 ENCOUNTER — Inpatient Hospital Stay (HOSPITAL_COMMUNITY): Payer: Medicaid Other

## 2020-04-21 ENCOUNTER — Inpatient Hospital Stay (HOSPITAL_COMMUNITY)
Admission: AD | Admit: 2020-04-21 | Discharge: 2020-04-21 | Disposition: A | Discharge status: Home / Self Care | Payer: Medicaid Other | Attending: Obstetrics & Gynecology | Admitting: Obstetrics & Gynecology

## 2020-04-21 DIAGNOSIS — Z3A09 9 weeks gestation of pregnancy: Secondary | ICD-10-CM | POA: Insufficient documentation

## 2020-04-21 DIAGNOSIS — R109 Unspecified abdominal pain: Secondary | ICD-10-CM | POA: Insufficient documentation

## 2020-04-21 DIAGNOSIS — O021 Missed abortion: Secondary | ICD-10-CM | POA: Insufficient documentation

## 2020-04-21 DIAGNOSIS — O039 Complete or unspecified spontaneous abortion without complication: Secondary | ICD-10-CM

## 2020-04-21 LAB — CBC WITH DIFFERENTIAL/PLATELET
Abs Immature Granulocytes: 0.03 10*3/uL (ref 0.00–0.07)
Basophils Absolute: 0 10*3/uL (ref 0.0–0.1)
Basophils Relative: 1 %
Eosinophils Absolute: 0.1 10*3/uL (ref 0.0–0.5)
Eosinophils Relative: 1 %
HCT: 36.2 % (ref 36.0–46.0)
Hemoglobin: 12 g/dL (ref 12.0–15.0)
Immature Granulocytes: 0 %
Lymphocytes Relative: 11 %
Lymphs Abs: 0.8 10*3/uL (ref 0.7–4.0)
MCH: 28.2 pg (ref 26.0–34.0)
MCHC: 33.1 g/dL (ref 30.0–36.0)
MCV: 85 fL (ref 80.0–100.0)
Monocytes Absolute: 0.6 10*3/uL (ref 0.1–1.0)
Monocytes Relative: 9 %
Neutro Abs: 5.6 10*3/uL (ref 1.7–7.7)
Neutrophils Relative %: 78 %
Platelets: 221 10*3/uL (ref 150–400)
RBC: 4.26 MIL/uL (ref 3.87–5.11)
RDW: 12.9 % (ref 11.5–15.5)
WBC: 7.1 10*3/uL (ref 4.0–10.5)
nRBC: 0 % (ref 0.0–0.2)

## 2020-04-21 MED ORDER — MISOPROSTOL 200 MCG PO TABS
800.0000 ug | ORAL_TABLET | Freq: Once | ORAL | 0 refills | Status: DC
Start: 2020-04-21 — End: 2021-04-17

## 2020-04-21 MED ORDER — PROMETHAZINE HCL 25 MG PO TABS: 25.0000 mg | ORAL_TABLET | Freq: Four times a day (QID) | ORAL | 0 refills | Status: DC | PRN

## 2020-04-21 MED ORDER — OXYCODONE HCL 5 MG PO TABS: 5.0000 mg | ORAL_TABLET | Freq: Four times a day (QID) | ORAL | 0 refills | Status: AC | PRN

## 2020-04-21 MED ORDER — PROMETHAZINE HCL 25 MG/ML IJ SOLN
12.5000 mg | Freq: Once | INTRAMUSCULAR | Status: AC
  Administered 2020-04-21: 12.5 mg via INTRAMUSCULAR
  Filled 2020-04-21: qty 1

## 2020-04-21 MED ORDER — ACETAMINOPHEN 500 MG PO TABS
1000.0000 mg | ORAL_TABLET | Freq: Once | ORAL | Status: AC
  Administered 2020-04-21: 13:00:00 1000 mg via ORAL
  Filled 2020-04-21: qty 2

## 2020-04-21 MED ORDER — HYDROMORPHONE HCL 1 MG/ML IJ SOLN
1.0000 mg | Freq: Once | INTRAMUSCULAR | Status: AC
  Administered 2020-04-21: 12:00:00 1 mg via INTRAMUSCULAR
  Filled 2020-04-21: qty 1

## 2020-04-21 NOTE — Discharge Instructions (Signed)

## 2020-04-21 NOTE — MAU Provider Note (Signed)
Chief Complaint: Miscarriage, Vaginal Bleeding, and Abdominal Pain   None    SUBJECTIVE HPI: Gloria Orr is a 19 y.o. G1P0 at [redacted]w[redacted]d who presents to [facility] via [facility] reporting abdominal pain & vaginal bleeding. Was diagnosed with a missed abortion. Took cytotec on 4/21. States she bled heavily & passed clots then had minimal bleeding until yesterday. Has filled 4 pads today and had intense abdominal cramping. Denies fever/chills.   Location: abdomen Quality: cramping Severity: 9/10 on pain scale Duration: 2 days Timing: intermittent Modifying factors: none Associated signs and symptoms: vaginal bleeding  Past Medical History:  Diagnosis Date  . Eczema   . Sinusitis    OB History  Gravida Para Term Preterm AB Living  1            SAB TAB Ectopic Multiple Live Births               # Outcome Date GA Lbr Len/2nd Weight Sex Delivery Anes PTL Lv  1 Current            Past Surgical History:  Procedure Laterality Date  . NO PAST SURGERIES     Social History   Socioeconomic History  . Marital status: Married    Spouse name: Not on file  . Number of children: Not on file  . Years of education: Not on file  . Highest education level: Not on file  Occupational History  . Not on file  Tobacco Use  . Smoking status: Never Smoker  . Smokeless tobacco: Never Used  Substance and Sexual Activity  . Alcohol use: Not Currently  . Drug use: Never  . Sexual activity: Yes  Other Topics Concern  . Not on file  Social History Narrative  . Not on file   Social Determinants of Health   Financial Resource Strain:   . Difficulty of Paying Living Expenses:   Food Insecurity:   . Worried About Programme researcher, broadcasting/film/video in the Last Year:   . Barista in the Last Year:   Transportation Needs:   . Freight forwarder (Medical):   Marland Kitchen Lack of Transportation (Non-Medical):   Physical Activity:   . Days of Exercise per Week:   . Minutes of Exercise per Session:   Stress:    . Feeling of Stress :   Social Connections:   . Frequency of Communication with Friends and Family:   . Frequency of Social Gatherings with Friends and Family:   . Attends Religious Services:   . Active Member of Clubs or Organizations:   . Attends Banker Meetings:   Marland Kitchen Marital Status:   Intimate Partner Violence:   . Fear of Current or Ex-Partner:   . Emotionally Abused:   Marland Kitchen Physically Abused:   . Sexually Abused:    Family History  Problem Relation Age of Onset  . Asthma Mother   . Hypertension Father   . Diabetes Father   . Heart disease Sister   . Asthma Sister   . Asthma Brother    No current facility-administered medications on file prior to encounter.   No current outpatient medications on file prior to encounter.   No Known Allergies  I have reviewed patient's Past Medical Hx, Surgical Hx, Family Hx, Social Hx, medications and allergies.   Review of Systems  Constitutional: Negative.   Gastrointestinal: Positive for abdominal pain. Negative for constipation, diarrhea, nausea and vomiting.  Genitourinary: Positive for vaginal bleeding. Negative for dysuria and vaginal  discharge.    OBJECTIVE Patient Vitals for the past 24 hrs:  BP Temp Temp src Pulse Resp SpO2  04/21/20 1231 112/66 99.4 F (37.4 C) Oral 86 16 --  04/21/20 1144 125/72 99.2 F (37.3 C) Oral 84 -- 100 %   Constitutional: Well-developed, well-nourished female in no acute distress.  Cardiovascular: normal rate & rhythm, no murmur Respiratory: normal rate and effort. Lung sounds clear throughout GI: Abd soft, non-tender, Pos BS x 4. No guarding or rebound tenderness MS: Extremities nontender, no edema, normal ROM Neurologic: Alert and oriented x 4.  GU:     SPECULUM EXAM: NEFG, small amount of dark red blood. No tissue at cervix    LAB RESULTS Results for orders placed or performed during the hospital encounter of 04/21/20 (from the past 24 hour(s))  CBC with  Differential/Platelet     Status: None   Collection Time: 04/21/20 12:37 PM  Result Value Ref Range   WBC 7.1 4.0 - 10.5 K/uL   RBC 4.26 3.87 - 5.11 MIL/uL   Hemoglobin 12.0 12.0 - 15.0 g/dL   HCT 36.2 36.0 - 46.0 %   MCV 85.0 80.0 - 100.0 fL   MCH 28.2 26.0 - 34.0 pg   MCHC 33.1 30.0 - 36.0 g/dL   RDW 12.9 11.5 - 15.5 %   Platelets 221 150 - 400 K/uL   nRBC 0.0 0.0 - 0.2 %   Neutrophils Relative % 78 %   Neutro Abs 5.6 1.7 - 7.7 K/uL   Lymphocytes Relative 11 %   Lymphs Abs 0.8 0.7 - 4.0 K/uL   Monocytes Relative 9 %   Monocytes Absolute 0.6 0.1 - 1.0 K/uL   Eosinophils Relative 1 %   Eosinophils Absolute 0.1 0.0 - 0.5 K/uL   Basophils Relative 1 %   Basophils Absolute 0.0 0.0 - 0.1 K/uL   Immature Granulocytes 0 %   Abs Immature Granulocytes 0.03 0.00 - 0.07 K/uL    IMAGING US OB Transvaginal  Result Date: 04/21/2020 CLINICAL DATA:  Vaginal bleeding, cramping, abortion. Rule out retained products of conception. EXAM: OBSTETRIC <14 WK [place] AND TRANSVAGINAL OB US TECHNIQUE: Both transabdominal and transvaginal ultrasound examinations were performed for complete evaluation of the gestation as well as the maternal uterus, adnexal regions, and pelvic cul-de-sac. Transvaginal technique was performed to assess early pregnancy. COMPARISON:  Ultrasound 04/13/2020 FINDINGS: Intrauterine gestational sac: None Yolk sac:  Not Visualized. Embryo:  Not Visualized. Cardiac Activity: Not Visualized. Maternal uterus/adnexae: Thickened heterogeneous endometrium measuring up to 1.6 cm in thickness. No definite internal vascularity within the endometrium. Right ovary measures 2.7 x 1.8 x 1.7 cm and appears unremarkable. Left ovary measures 3.3 x 1.9 x 2.5 cm and appears unremarkable. Trace free fluid within the cul-de-sac. IMPRESSION: 1. No evidence of intrauterine pregnancy. 2. Thickened, heterogeneous endometrium measuring up to 1.6 cm. Differential includes retained products of conception as well as  internal blood products. Electronically Signed   By: Davina Poke D.O.   On: 04/21/2020 13:12    MAU COURSE Orders Placed This Encounter  Procedures  . US OB Transvaginal  . CBC with Differential/Platelet  . Discharge patient   Meds ordered this encounter  Medications  . promethazine (PHENERGAN) injection 12.5 mg  . HYDROmorphone (DILAUDID) injection 1 mg  . acetaminophen (TYLENOL) tablet 1,000 mg  . promethazine (PHENERGAN) 25 MG tablet    Sig: Take 1 tablet (25 mg total) by mouth every 6 (six) hours as needed for nausea or vomiting.  Dispense:  30 tablet    Refill:  0    Order Specific Question:   Supervising Provider    Answer:   Jaynie Collins A [[id number]]  . misoprostol (CYTOTEC) 200 MCG tablet    Sig: Take 4 tablets (800 mcg total) by mouth once for 1 dose.    Dispense:  4 tablet    Refill:  0    Order Specific Question:   Supervising Provider    Answer:   Jaynie Collins A [[id number]]    MDM Vital signs stable. Patient given IM dilaudid & phenergan for pain management prior to exam. On exam, minimal bleeding & no tissue visualized at os. Temp up to 99.4 orally -- no leukocytosis on CBC. Ultrasound shows no evidence of retained POCs, some blood product in uterus & thickness of 1.6cm.   Reviewed with Dr. Macon Large. Will give some cytotec to clear our uterus. No need for antibiotics at this time.   ASSESSMENT 1. Miscarriage     PLAN Discharge home in stable condition. Rx cytotec, oxycodone, & refilled phenergan Take ibuprofen for pain, oxycodone for breakthrough pain Discussed reasons to return to MAU  Allergies as of 04/21/2020   No Known Allergies     Medication List    STOP taking these medications   metoCLOPramide 10 MG tablet Commonly known as: REGLAN     TAKE these medications   ibuprofen 800 MG tablet Commonly known as: ADVIL Take 1 tablet (800 mg total) by mouth every 8 (eight) hours as needed.   misoprostol 200 MCG tablet Commonly known as:  CYTOTEC Take 4 tablets (800 mcg total) by mouth once for 1 dose. What changed:   how much to take  when to take this   promethazine 25 MG tablet Commonly known as: PHENERGAN Take 1 tablet (25 mg total) by mouth every 6 (six) hours as needed for nausea or vomiting.        Judeth Horn, NP 04/21/2020  1:57 PM

## 2020-05-14 ENCOUNTER — Ambulatory Visit (INDEPENDENT_AMBULATORY_CARE_PROVIDER_SITE_OTHER): Payer: Medicaid Other | Admitting: Obstetrics and Gynecology

## 2020-05-14 ENCOUNTER — Other Ambulatory Visit: Payer: Self-pay

## 2020-05-14 VITALS — BP 119/75 | HR 80 | Wt 120.0 lb

## 2020-05-14 DIAGNOSIS — Z3A13 13 weeks gestation of pregnancy: Secondary | ICD-10-CM | POA: Diagnosis not present

## 2020-05-14 DIAGNOSIS — O021 Missed abortion: Secondary | ICD-10-CM

## 2020-05-14 NOTE — Progress Notes (Signed)
Subjective:  Gloria Orr is a 19 y.o. G1P0 at [redacted]w[redacted]d who presents today for FU BHCG. She was seen on 03/24/20.  Results from that day show no IUP on [place], and HCG 17,000. She denies vaginal bleeding. She denies abdominal or pelvic pain.  Objective:  Physical Exam  Nursing note and vitals reviewed. Constitutional: She is oriented to person, place, and time. She appears well-developed and well-nourished. No distress.  HENT:  Head: Normocephalic.  Cardiovascular: Normal rate.  Respiratory: Effort normal.  GI: Soft. There is no tenderness.  Neurological: She is alert and oriented to person, place, and time. Skin: Skin is warm and dry.  Psychiatric: She has a normal mood and affect.   Results for orders placed or performed in visit on 05/14/20 (from the past 24 hour(s))  Beta hCG quant (ref lab)     Status: None   Collection Time: 05/14/20  4:45 PM  Result Value Ref Range   hCG Quant 31 mIU/mL   Narrative   Performed at:  [address], [place], [place]  [id number] Lab Director: Jolene Schimke MD, Phone:  [phone]    Assessment/Plan: SAB Quant declining.  Will bring patient back in 1 week and follow Hcg levels to zero. She is doing well   Venia Carbon I, NP 05/15/2020 10:00 AM

## 2020-05-15 LAB — BETA HCG QUANT (REF LAB): hCG Quant: 31 m[IU]/mL

## 2020-05-21 ENCOUNTER — Other Ambulatory Visit: Payer: Self-pay | Admitting: *Deleted

## 2020-05-21 DIAGNOSIS — O039 Complete or unspecified spontaneous abortion without complication: Secondary | ICD-10-CM

## 2020-05-22 ENCOUNTER — Other Ambulatory Visit: Payer: Medicaid Other

## 2020-05-22 ENCOUNTER — Other Ambulatory Visit: Payer: Self-pay

## 2020-05-22 DIAGNOSIS — O039 Complete or unspecified spontaneous abortion without complication: Secondary | ICD-10-CM

## 2020-05-23 LAB — BETA HCG QUANT (REF LAB): hCG Quant: 18 m[IU]/mL

## 2020-05-28 ENCOUNTER — Other Ambulatory Visit: Payer: Self-pay | Admitting: *Deleted

## 2020-05-28 DIAGNOSIS — O039 Complete or unspecified spontaneous abortion without complication: Secondary | ICD-10-CM

## 2020-05-29 ENCOUNTER — Other Ambulatory Visit: Payer: Medicaid Other

## 2020-05-29 ENCOUNTER — Other Ambulatory Visit: Payer: Self-pay

## 2020-05-29 ENCOUNTER — Other Ambulatory Visit: Payer: Self-pay | Admitting: Family Medicine

## 2020-05-29 DIAGNOSIS — R630 Anorexia: Secondary | ICD-10-CM

## 2020-05-29 DIAGNOSIS — O039 Complete or unspecified spontaneous abortion without complication: Secondary | ICD-10-CM

## 2020-05-30 LAB — BETA HCG QUANT (REF LAB): hCG Quant: 12 m[IU]/mL

## 2020-06-01 ENCOUNTER — Telehealth: Payer: Self-pay

## 2020-06-01 ENCOUNTER — Encounter: Payer: Self-pay | Admitting: Registered"

## 2020-06-01 ENCOUNTER — Other Ambulatory Visit: Payer: Self-pay

## 2020-06-01 ENCOUNTER — Encounter: Payer: Medicaid Other | Attending: Family Medicine | Admitting: Registered"

## 2020-06-01 DIAGNOSIS — R630 Anorexia: Secondary | ICD-10-CM | POA: Diagnosis not present

## 2020-06-01 DIAGNOSIS — O039 Complete or unspecified spontaneous abortion without complication: Secondary | ICD-10-CM

## 2020-06-01 NOTE — Patient Instructions (Addendum)
Instructions/Goals:   Eating Schedule:   Breakfast: Within 1 hour of waking  Snack: 2 hours after breakfast   Lunch: Within 4-5 hours of breakfast  Snack: 2 hours after lunch    Dinner: Within 4-5 hours of Lunch   Will start with getting in 3 meals per day and may add snacks.   If unable to eat solid foods at each mealtime, have a Boost Plus or regular Boost drink.   Recommend a multivitamin with 18 mg iron due to limited intake.   Recommend a calcium supplement of 500 mg daily   Recommend referral to neurology and dermatology.

## 2020-06-01 NOTE — Progress Notes (Signed)
Medical Nutrition Therapy:  Appt start time: 7564 end time:  1600.  Assessment:  Primary concerns today: Pt referred due to decreased appetite. Pt present for appointment alone.   Pt reports depression, denies SI. Reports wanting to sleep more than usual and feeling low energy, tired often. Reports having headaches about 80% of the time, particularly when changing elevations such as when climbing stairs. Reports some dizziness with it, but denies feeling faint. Reports having headaches since 2013. Reports she has not seen a neurologist since moving to the [place]. Did have a CT when living in [place] and reports it was normal.   Pt moved to the [place] from [place] 5 months ago. She reports her husband and his family are from the [place] and live here. Pt reports it has been an adjustment but she has liked it here so far.   Pt also reports having a sensitive scalp as well. Reports she tried an OTC cream when in [place] and it helped but then lost it since then. Reports the pain is constant and sometimes makes it hard to lay down on a pillow. Reports she made a cream with avocado and essential oils which has helped but still is an issue.   Pt reports she is never hungry and doesn't eat much. Pt reports this has been ongoing for as long as she can remember. Reports about 2-3 years ago she worked hard to gain weight and exercise and was able to gain some weight. Reports her husband will make her eat sometimes. Reports eating 1 meal per day. Reports if she eats when she is not hungry nothing will happen she is just not motivated to eat if she is not hungry. Reports she may be hungry for "10 seconds" and then won't be hungry any the rest of the day.   Pt usually wakes 8-11 AM, varies.   Food Allergies/Intolerances: None reported.   GI Concerns: None reported.   Pertinent Lab Values: N/A  Preferred Learning Style:  No preference indicated   Learning Readiness:   Ready  MEDICATIONS: None reported.    DIETARY  INTAKE:  Usual eating pattern includes 1 meal and does not usually snack. Pt reports meal times vary according to when she is forced to eat by her husband. Reports not feeling motivated to eat if she is not hungry. Reports may feel hungry for "10 seconds" out of the day.   Common foods: Varies.  Avoided foods: None reported.  Favorite foods include sea food (shrimp, salmon) and chicken. Reports she likes how her husband prepares them with butter and seafood spices. Pt likes dairy: milk, chocolate milk, yogurt, cheese. Pt likes peanut butter.   Typical Snacks: None reported.   Typical Beverages: orange juice, 2-3 bottles water.   Location of Meals: N/A  Electronics Present at [employer]: N/A  24-hr recall:  B ( AM): None reported.  Snk ( AM): None reported.  L (12 PM): chips, chocolate candies, water  Snk ( PM): None reported.  D ( PM): grilled chicken, soup, corn, orange Snk ( PM): None reported.  Beverages: 2-3 bottles water, orange juice.   Usual physical activity: None reported. Minutes/Week: N/A  Estimated energy needs: 3329-5188 calories 210-336 g carbohydrates 44 g protein 42-80 g fat  Progress Towards Goal(s):  In progress.   Nutritional Diagnosis:  NI-2.1 Inadequate intake As related to low appetite, hx of low intake/skipping meals.  As evidenced by pt reports hx and current poor intake, reported dietary recall, reported headaches and  low energy.    Intervention:  Nutrition counseling provided. Dietitian provided education regarding balanced nutrition and need for eating consistently throughout the day. Counseled on how skipping meals, inadequate intake can lead to low energy level and sometimes headaches and dizziness. Still recommend pt see a neurologist given hx of headaches and as we cannot say for certain that headaches are completely due to poor eating habits. Recommended pt also talk with her doctor about referral for dermatology for complaints of sore scalp.  Discussed setting a consistent eating routine and how even if starting with small amounts, eating something every 3-5 hours can help reestablish appetite. Discussed having a Boost if unable to eat solid foods at those mealtimes (samples provided). Can do regular Boost or Plus, recommended Plus if unable to drink the whole drink. Pt appeared agreeable to information/goals discussed.   Instructions/Goals:   Eating Schedule:   Breakfast: Within 1 hour of waking  Snack: 2 hours after breakfast   Lunch: Within 4-5 hours of breakfast  Snack: 2 hours after lunch    Dinner: Within 4-5 hours of Lunch   Will start with getting in 3 meals per day and may add snacks.   If unable to eat solid foods at each mealtime, have a Boost Plus or regular Boost drink.   Recommend a multivitamin with 18 mg iron due to limited intake.   Recommend a calcium supplement of 500 mg daily   Recommend referral to neurology and dermatology.   Teaching Method Utilized:  Visual Auditory  Handouts given during visit include:  Balanced plate.   Eating Check list.   Boost Plus samples (vanilla and chocolate)   Barriers to learning/adherence to lifestyle change: None reported.   Demonstrated degree of understanding via:  Teach Back   Monitoring/Evaluation:  Dietary intake, exercise, and body weight in 4 week(s).

## 2020-06-01 NOTE — Telephone Encounter (Addendum)
-----   Message from Duane Lope, NP sent at 06/01/2020 10:08 AM EDT ----- Patient will need to return in 1 week for non stat quant. We will follow quants down to <5.  Thank you.  Called pt and L/M that I am calling with results and to schedule a f/u appt in one week.  Please give the office a return call back.  Addison Naegeli, RN

## 2020-06-01 NOTE — Telephone Encounter (Signed)
Called pt and L/M for pt that we have scheduled her a f/u appt on 06/05/20 @ 1000 for rpt lab work. If she has any questions to please give the office a call.   Addison Naegeli, RN

## 2020-06-05 ENCOUNTER — Other Ambulatory Visit: Payer: Medicaid Other

## 2020-07-01 ENCOUNTER — Ambulatory Visit: Payer: Medicaid Other | Admitting: Registered"

## 2020-12-26 NOTE — L&D Delivery Note (Signed)
[facility]N [facility] Practice Delivery Note  Gloria Orr is a 20 y.o. G2P1011 s/p VD at [redacted]w[redacted]d. She was admitted for IOL d/t DFM.   ROM: 4h 68m with clear fluid GBS Status:  Negative/-- (11/16 1154) Maximum Maternal Temperature: 98.49F  Labor Progress: Initial SVE: 1.5/-3. She then progressed to complete with assistance of cytotec and FB, then expectantly progressed to complete afterwards.   Delivery Date/Time: 12/16 0540  Delivery: Called to room and patient was complete and pushing, pushed for approximately 1.5 hours. Head delivered L OA. No nuchal cord present. Shoulder and body delivered in usual fashion. Infant with spontaneous cry, placed on mother's abdomen, dried and stimulated. Cord clamped x 2 after 1-minute delay, and cut by FOB. Cord blood drawn. Placenta delivered spontaneously with gentle cord traction. Fundus firm with massage and Pitocin. Labia, perineum, vagina, and cervix inspected with a 2nd degree perineal laceration that was repaired in usual fashion with 3.0 Monocryl. A rectal exam was performed prior to repair with intact sphincter/rectal tissue.   Baby Weight: pending  Placenta: 3 vessel, intact. Sent to L&D Complications: None Lacerations: 2nd degree perineal  EBL: 375 mL Analgesia: Epidural   Infant:  APGAR (1 MIN): 9   APGAR (5 MINS): 9    Leticia Penna, DO  OB Family Medicine Fellow, [facility] for [facility], [facility] 12/10/2021, 8:59 AM

## 2021-02-13 ENCOUNTER — Other Ambulatory Visit: Payer: Self-pay

## 2021-02-13 ENCOUNTER — Ambulatory Visit (HOSPITAL_COMMUNITY)
Admission: EM | Admit: 2021-02-13 | Discharge: 2021-02-13 | Disposition: A | Discharge status: Home / Self Care | Payer: BC Managed Care – PPO | Attending: Medical Oncology | Admitting: Medical Oncology

## 2021-02-13 ENCOUNTER — Encounter (HOSPITAL_COMMUNITY): Payer: Self-pay | Admitting: *Deleted

## 2021-02-13 DIAGNOSIS — H6123 Impacted cerumen, bilateral: Secondary | ICD-10-CM

## 2021-02-13 NOTE — ED Triage Notes (Addendum)
Pt reports she went swimming 5 days ago and now has Lt ear pain. Pt later stated going to her PCP yesterday for ear wax removal. The PCP was unable to remove the ear wax with irrigation and referred Pt to ENT. Pt reports she can not wait  That long because of ear pain.

## 2021-02-13 NOTE — ED Provider Notes (Signed)
[facility]    CSN: [id number] Arrival date & time: 02/13/21  1125      History   Chief Complaint Chief Complaint  Patient presents with  . Otalgia    LT    HPI Eriyah Girdner is a 20 y.o. female.   HPI   Otalgia: Patient states that about 5 days ago she went swimming.  Following this event she had left ear fullness and decreased hearing.  She tried to use a Q-tip but this made it worse.  She went to her primary care office and they tried irrigation but this was painful at the time towards the end of the procedure so she elected to see ENT instead.  She states that she would like to attempt cerumen removal again today as the decreased hearing is bothering her.  She denies any ear pain currently and does not have any fever, history of ear trauma, significant cold symptoms.  Past Medical History:  Diagnosis Date  . Eczema   . Sinusitis     There are no problems to display for this patient.   Past Surgical History:  Procedure Laterality Date  . NO PAST SURGERIES      OB History    Gravida  1   Para      Term      Preterm      AB      Living        SAB      IAB      Ectopic      Multiple      Live Births               Home Medications    Prior to Admission medications   Medication Sig Start Date End Date Taking? Authorizing Provider  ibuprofen (ADVIL) 800 MG tablet Take 1 tablet (800 mg total) by mouth every 8 (eight) hours as needed. Patient not taking: Reported on 06/01/2020 04/15/20   Tereso Newcomer, MD  misoprostol (CYTOTEC) 200 MCG tablet Take 4 tablets (800 mcg total) by mouth once for 1 dose. 04/21/20 04/21/20  Judeth Horn, NP  promethazine (PHENERGAN) 25 MG tablet Take 1 tablet (25 mg total) by mouth every 6 (six) hours as needed for nausea or vomiting. Patient not taking: Reported on 05/14/2020 04/21/20   Judeth Horn, NP    Family History Family History  Problem Relation Age of Onset  . Asthma Mother   . Hypertension  Father   . Diabetes Father   . Heart disease Sister   . Asthma Sister   . Asthma Brother     Social History Social History   Tobacco Use  . Smoking status: Never Smoker  . Smokeless tobacco: Never Used  Vaping Use  . Vaping Use: Never used  Substance Use Topics  . Alcohol use: Not Currently  . Drug use: Never     Allergies   Patient has no known allergies.   Review of Systems Review of Systems  As stated above in HPI Physical Exam Triage Vital Signs ED Triage Vitals  Enc Vitals Group     BP 02/13/21 1212 106/71     Pulse Rate 02/13/21 1212 78     Resp 02/13/21 1212 16     Temp 02/13/21 1212 98.5 F (36.9 C)     Temp Source 02/13/21 1212 Oral     SpO2 02/13/21 1212 98 %     Weight --      Height --  Head Circumference --      Peak Flow --      Pain Score 02/13/21 1207 6     Pain Loc --      Pain Edu? --      Excl. in GC? --    No data found.  Updated Vital Signs BP 106/71 (BP Location: Left Arm)   Pulse 78   Temp 98.5 F (36.9 C) (Oral)   Resp 16   LMP 02/04/2021   SpO2 98%   Breastfeeding No   Physical Exam Vitals and nursing note reviewed.  Constitutional:      General: She is not in acute distress.    Appearance: She is not ill-appearing, toxic-appearing or diaphoretic.  HENT:     Head: Normocephalic.     Right Ear: Hearing, ear canal and external ear normal. No drainage, swelling or tenderness. No middle ear effusion. There is impacted cerumen. No foreign body. No mastoid tenderness. No PE tube.     Left Ear: Hearing, ear canal and external ear normal. No drainage, swelling or tenderness.  No middle ear effusion. There is impacted cerumen. No foreign body. No mastoid tenderness. No PE tube.  Neurological:     Mental Status: She is alert.      UC Treatments / Results  Labs (all labs ordered are listed, but only abnormal results are displayed) Labs Reviewed - No data to display  EKG   Radiology No results  found.  Procedures Procedures (including critical care time)  Medications Ordered in UC Medications - No data to display  Initial Impression / Assessment and Plan / UC Course  I have reviewed the triage vital signs and the nursing notes.  Pertinent labs & imaging results that were available during my care of the patient were reviewed by me and considered in my medical decision making (see chart for details).     New.  We will attempt cerumen removal however I did discuss with patient that should she have any discomfort we need to stop this procedure and I discussed the specialized tools and imaging that is available to ENTs that we do not have here.  If cerumen removal is unsuccessful here I would recommend that she use Debrox drops up until her appointment with ENT. We reviewed red flag signs and symptoms along with risks and benefits of the procedure.  Final Clinical Impressions(s) / UC Diagnoses   Final diagnoses:  None   Discharge Instructions   None    ED Prescriptions    None     PDMP not reviewed this encounter.   Rushie Chestnut, [place] 02/13/21 1234

## 2021-03-25 ENCOUNTER — Encounter: Payer: Self-pay | Admitting: *Deleted

## 2021-03-26 ENCOUNTER — Ambulatory Visit: Payer: BC Managed Care – PPO | Admitting: Neurology

## 2021-03-26 ENCOUNTER — Encounter: Payer: Self-pay | Admitting: Neurology

## 2021-03-26 ENCOUNTER — Telehealth: Payer: Self-pay | Admitting: Neurology

## 2021-03-26 NOTE — Progress Notes (Deleted)
[facility]    Provider:  Dr Lucia Gaskins Requesting Provider: Ralene Ok, MD Primary Care Provider:  Patient, No Pcp Per (Inactive)  CC:  ***  HPI:  Gloria Orr is a 20 y.o. female here as requested by Ralene Ok, MD for headaches.  I reviewed Dr. 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  I reviewed blood work from June 2021 which included negative pregnancy urine, normal CBC, normal CMP with BUN 11 and creatinine 0.6, TB negative, TSH normal 2.21.  She was diagnosed with migraine headaches, she was given a sample of q. left as well, referred to neurology.    Reviewed notes, labs and imaging from outside physicians, which showed ***  Review of Systems: Patient complains of symptoms per HPI as well as the following symptoms ***. Pertinent negatives and positives per HPI. All others negative.   Social History   Socioeconomic History  . Marital status: Married    Spouse name: Not on file  . Number of children: 0  . Years of education: Not on file  . Highest education level: Not on file  Occupational History  . Occupation: Currently unemployed  Tobacco Use  . Smoking status: Never Smoker  . Smokeless tobacco: Never Used  Vaping Use  . Vaping Use: Never used  Substance and Sexual Activity  . Alcohol use: Not Currently   . Drug use: Never  . Sexual activity: Yes  Other Topics Concern  . 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 Worried About Programme researcher, broadcasting/film/video in the Last Year: Never true  . Ran Out of Food in the Last Year: Never true  Transportation Needs: No Transportation Needs  . Lack of Transportation (Medical): No  . Lack of Transportation (Non-Medical): No  Physical Activity: Not on file  Stress: Not on file  Social Connections: Not on file  Intimate Partner Violence: Not on file    Family History  Problem Relation Age of Onset  . Asthma Mother   . Hypertension Father   . Diabetes Father   . Heart disease Sister   . Asthma Sister   . Asthma Brother     Past Medical History:  Diagnosis Date  . Chronic headaches   . Depression with suicidal ideation   . Eczema   . Seborrheic keratosis of scalp   . Sinusitis   . Stress incontinence     There are no problems to display for this patient.   Past Surgical History:  Procedure Laterality Date  . NO PAST SURGERIES      Current Outpatient Medications  Medication Sig Dispense Refill  . ibuprofen (ADVIL) 800 MG tablet Take 1 tablet (800 mg total) by mouth every 8 (eight) hours as needed. (Patient not taking: Reported on 06/01/2020) 30 tablet 1  . misoprostol (CYTOTEC) 200 MCG tablet Take 4 tablets (800 mcg total) by mouth once for 1 dose. 4 tablet 0  . promethazine (PHENERGAN) 25 MG tablet Take 1 tablet (25 mg total) by mouth every 6 (six) hours as needed for nausea or vomiting. (Patient not taking: Reported on 05/14/2020) 30 tablet 0   No current facility-administered medications for this visit.    Allergies as of 03/26/2021  . (No Known Allergies)    Vitals: There were no vitals taken for this visit. Last  Weight:  Wt Readings from Last 1 Encounters:  06/01/20 122 lb 1.6 oz (55.4 kg)   Last Height:   Ht Readings from Last 1 Encounters:  06/01/20 5\' 2"  (1.575 m)     Physical exam: Exam: Gen: NAD, conversant, well nourised, obese, well groomed                     CV: RRR, no MRG. No Carotid Bruits. No peripheral edema, warm, nontender Eyes: Conjunctivae clear without exudates or hemorrhage  Neuro: Detailed Neurologic Exam  Speech:    Speech is normal; fluent and spontaneous with normal comprehension.  Cognition:    The patient is oriented to person, place, and time;     recent and remote memory intact;     language fluent;     normal attention, concentration,     fund of knowledge Cranial Nerves:    The pupils are equal, round, and reactive to light. The fundi are normal and spontaneous venous pulsations are present. Visual fields are full to finger confrontation. Extraocular movements are intact. Trigeminal sensation is intact and the muscles of mastication are normal. The face is symmetric. The palate elevates in the midline. Hearing intact. Voice is normal. Shoulder shrug is normal. The tongue has normal motion without fasciculations.   Coordination:    Normal finger to nose and heel to shin. Normal rapid alternating movements.   Gait:    Heel-toe and tandem gait are normal.   Motor Observation:    No asymmetry, no atrophy, and no involuntary movements noted. Tone:    Normal muscle tone.    Posture:    Posture is normal. normal erect    Strength:    Strength is V/V in the upper and lower limbs.      Sensation: intact to LT     Reflex Exam:  DTR's:    Deep tendon reflexes in the upper and lower extremities are normal bilaterally.   Toes:    The toes are downgoing bilaterally.   Clonus:    Clonus is absent.    Assessment/Plan:    No orders of the defined types were placed in this encounter.  No orders of the defined types were placed in this  encounter.   Cc: , MD,  Patient, No Pcp Per (Inactive)  Ralene Ok, MD  [facility] [address] [place], [place]  Phone [phone] Fax [phone]

## 2021-03-26 NOTE — Telephone Encounter (Signed)
Patient no showed new patient appointment in neurology today.  If she calls back she can be rescheduled with any provider but please inform her that we have many patients waiting for openings and that another no-show or cancellation may result in her dismissal from our practice.

## 2021-04-17 ENCOUNTER — Other Ambulatory Visit: Payer: Self-pay

## 2021-04-17 ENCOUNTER — Inpatient Hospital Stay (HOSPITAL_COMMUNITY): Payer: BC Managed Care – PPO

## 2021-04-17 ENCOUNTER — Encounter (HOSPITAL_COMMUNITY): Payer: Self-pay | Admitting: Obstetrics and Gynecology

## 2021-04-17 ENCOUNTER — Inpatient Hospital Stay (HOSPITAL_COMMUNITY)
Admission: AD | Admit: 2021-04-17 | Discharge: 2021-04-17 | Disposition: A | Discharge status: Home / Self Care | Payer: BC Managed Care – PPO | Attending: Obstetrics and Gynecology | Admitting: Obstetrics and Gynecology

## 2021-04-17 DIAGNOSIS — O208 Other hemorrhage in early pregnancy: Secondary | ICD-10-CM | POA: Insufficient documentation

## 2021-04-17 DIAGNOSIS — O26891 Other specified pregnancy related conditions, first trimester: Secondary | ICD-10-CM

## 2021-04-17 DIAGNOSIS — Z3A01 Less than 8 weeks gestation of pregnancy: Secondary | ICD-10-CM | POA: Insufficient documentation

## 2021-04-17 DIAGNOSIS — O209 Hemorrhage in early pregnancy, unspecified: Secondary | ICD-10-CM | POA: Diagnosis not present

## 2021-04-17 DIAGNOSIS — R109 Unspecified abdominal pain: Secondary | ICD-10-CM | POA: Insufficient documentation

## 2021-04-17 DIAGNOSIS — O418X10 Other specified disorders of amniotic fluid and membranes, first trimester, not applicable or unspecified: Secondary | ICD-10-CM

## 2021-04-17 LAB — URINALYSIS, ROUTINE W REFLEX MICROSCOPIC
Bacteria, UA: NONE SEEN
Bilirubin Urine: NEGATIVE
Glucose, UA: NEGATIVE mg/dL
Ketones, ur: 5 mg/dL — AB
Leukocytes,Ua: NEGATIVE
Nitrite: NEGATIVE
Protein, ur: 30 mg/dL — AB
RBC / HPF: >50 RBC/hpf — ABNORMAL HIGH (ref 0–5)
Specific Gravity, Urine: 1.027 (ref 1.005–1.030)
pH: 5 (ref 5.0–8.0)

## 2021-04-17 LAB — HIV ANTIBODY (ROUTINE TESTING W REFLEX): HIV Screen 4th Generation wRfx: NONREACTIVE

## 2021-04-17 LAB — CBC
HCT: 40.5 % (ref 36.0–46.0)
Hemoglobin: 13.4 g/dL (ref 12.0–15.0)
MCH: 28.2 pg (ref 26.0–34.0)
MCHC: 33.1 g/dL (ref 30.0–36.0)
MCV: 85.3 fL (ref 80.0–100.0)
Platelets: 287 10*3/uL (ref 150–400)
RBC: 4.75 MIL/uL (ref 3.87–5.11)
RDW: 12.9 % (ref 11.5–15.5)
WBC: 5.9 10*3/uL (ref 4.0–10.5)
nRBC: 0 % (ref 0.0–0.2)

## 2021-04-17 LAB — POCT PREGNANCY, URINE: Preg Test, Ur: POSITIVE — AB

## 2021-04-17 LAB — HCG, QUANTITATIVE, PREGNANCY: hCG, Beta Chain, Quant, S: 29451 m[IU]/mL — ABNORMAL HIGH (ref <5)

## 2021-04-17 NOTE — MAU Note (Signed)
Pt reports vaginal bleeding that started 2 hours ago, mild lower abd cramping.

## 2021-04-17 NOTE — MAU Provider Note (Signed)
Patient Gloria Orr is a 20 y.o.  G2P0010 At [redacted]w[redacted]d here with complaints of vaginal bleeding that started today at 4:30 pm. She started having a little abdominal pain last night, but none during the day today. She then had some bleeding; she was passing clots at first but no clots now.   She denies vomiting but some nausea, diarrhea, constipation, fever, SOB.   She had intercourse last night. She is very worried because she had a miscarriage a year ago.  History     CSN: [id number]  Arrival date and time: 04/17/21 1740   None     Chief Complaint  Patient presents with  . Vaginal Bleeding  . Abdominal Pain   Vaginal Bleeding The patient's primary symptoms include vaginal bleeding. This is a new problem. The current episode started today. The problem occurs intermittently. The problem has been gradually improving. Associated symptoms include abdominal pain and nausea. Pertinent negatives include no constipation, diarrhea or dysuria. The vaginal discharge was bloody. The vaginal bleeding is typical of menses. She has been passing clots (but none now). Nothing aggravates the symptoms. She has tried nothing for the symptoms.  Abdominal Pain Associated symptoms include nausea. Pertinent negatives include no constipation, diarrhea or dysuria.    OB History    Gravida  2   Para      Term      Preterm      AB  1   Living        SAB  1   IAB      Ectopic      Multiple      Live Births              Past Medical History:  Diagnosis Date  . Chronic headaches   . Depression with suicidal ideation   . Eczema   . Seborrheic keratosis of scalp   . Sinusitis   . Stress incontinence     Past Surgical History:  Procedure Laterality Date  . NO PAST SURGERIES      Family History  Problem Relation Age of Onset  . Asthma Mother   . Hypertension Father   . Diabetes Father   . Heart disease Sister   . Asthma Sister   . Asthma Brother     Social History   Tobacco  Use  . Smoking status: Never Smoker  . Smokeless tobacco: Never Used  Vaping Use  . Vaping Use: Never used  Substance Use Topics  . Alcohol use: Not Currently  . Drug use: Never    Allergies: No Known Allergies  Medications Prior to Admission  Medication Sig Dispense Refill Last Dose  . Prenatal Vit-Fe Fumarate-FA (PRENATAL VITAMINS PO) Take 1 tablet by mouth daily.   04/17/2021 at Unknown time  . Fluocinolone Acetonide Scalp 0.01 % OIL APPLY 1 A SMALL AMOUNT TO AFFECTED AREA AS DIRECTED APPLY TO SCALP 2-3 TIMES WEEKLY     . ibuprofen (ADVIL) 800 MG tablet Take 1 tablet (800 mg total) by mouth every 8 (eight) hours as needed. (Patient not taking: Reported on 06/01/2020) 30 tablet 1   . ketoconazole (NIZORAL) 2 % shampoo APPLY TO SCALP 2-3 TIMES WEEKLY, LET SIT FOR 5 MINUTES THEN RINSE THOROUGHLY     . misoprostol (CYTOTEC) 200 MCG tablet Take 4 tablets (800 mcg total) by mouth once for 1 dose. 4 tablet 0   . promethazine (PHENERGAN) 25 MG tablet Take 1 tablet (25 mg total) by mouth every 6 (six) hours  as needed for nausea or vomiting. (Patient not taking: Reported on 05/14/2020) 30 tablet 0     Review of Systems  Constitutional: Negative.   Respiratory: Negative.   Cardiovascular: Negative.   Gastrointestinal: Positive for abdominal pain and nausea. Negative for constipation and diarrhea.  Genitourinary: Positive for vaginal bleeding. Negative for dysuria.  Neurological: Negative.    Physical Exam   Blood pressure (!) 109/59, pulse 70, temperature 98.5 F (36.9 C), temperature source Oral, resp. rate 16, height 5\' 2"  (1.575 m), weight 54.4 kg, last menstrual period 03/03/2021, SpO2 100 %.  Physical Exam Constitutional:      Appearance: She is well-developed.  HENT:     Head: Normocephalic.  Pulmonary:     Effort: Pulmonary effort is normal.  Abdominal:     General: Abdomen is flat.     Palpations: Abdomen is soft.     Tenderness: There is no abdominal tenderness.   Neurological:     Mental Status: She is alert.     MAU Course  Procedures  MDM -due to patient's complaint, will do complete ectopic work-up -05/03/2021 shows SIUP with cardiac activity. I have independently reviewed the [place] images, which reveal finding of SIUP with cardiac activity -moderate subchorionic hemorrhage -Blood type reviewed is O pos Assessment and Plan   1. Subchorionic hemorrhage of placenta in first trimester, single or unspecified fetus   2. Abdominal pain   -patient advised to have pelvic rest, to continue going to school as normal, patient is tearful; we discussed coping mechanisms -viability scan ordered for 2 weeks -patient declined cultures and pelvic exam -return to MAU if bleeding worsens or pain worsesn, advised normal diet and lots of hydration [place] Gloria Orr 04/17/2021, 10:04 PM

## 2021-04-17 NOTE — Discharge Instructions (Signed)
Subchorionic Hematoma  A hematoma is a collection of blood outside of the blood vessels. A subchorionic hematoma is a collection of blood between the outer wall of the embryo (chorion) and the inner wall of the uterus. This condition can cause vaginal bleeding. Early small hematomas usually shrink on their own and do not affect your baby or pregnancy. When bleeding starts later in pregnancy, or if the hematoma is larger or occurs in older pregnant women, the condition may be more serious. Larger hematomas increase the chances of miscarriage. This condition also increases the risk of:  Premature separation of the placenta from the uterus.  Premature (preterm) labor.  Stillbirth. What are the causes? The exact cause of this condition is not known. It occurs when blood is trapped between the placenta and the uterine wall because the placenta has separated from the original site of implantation. What increases the risk? You are more likely to develop this condition if:  You were treated with fertility medicines.  You became pregnant through in vitro fertilization (IVF). What are the signs or symptoms? Symptoms of this condition include:  Vaginal spotting or bleeding.  Abdominal pain. This is rare. Sometimes you may have no symptoms and the bleeding may only be seen when ultrasound images are taken (transvaginal ultrasound). How is this diagnosed? This condition is diagnosed based on a physical exam. This includes a pelvic exam. You may also have other tests, including:  Blood tests.  Urine tests.  Ultrasound of the abdomen. How is this treated? Treatment for this condition can vary. Treatment may include:  Watchful waiting. You will be monitored closely for any changes in bleeding.  Medicines.  Activity restriction. This may be needed until the bleeding stops.  A medicine called Rh immunoglobulin. This is given if you have an Rh-negative blood type. It prevents Rh  sensitization. Follow these instructions at home:  Stay on bed rest if told to do so by your health care provider.  Do not lift anything that is heavier than 10 lb (4.5 kg), or the limit that you are told by your health care provider.  Track and write down the number of pads you use each day and how soaked (saturated) they are.  Do not use tampons.  Keep all follow-up visits. This is important. Your health care provider may ask you to have follow-up blood tests or ultrasound tests or both. Contact a health care provider if:  You have any vaginal bleeding.  You have a fever. Get help right away if:  You have severe cramps in your stomach, back, abdomen, or pelvis.  You pass large clots or tissue. Save any tissue for your health care provider to look at.  You faint.  You become light-headed or weak. Summary  A subchorionic hematoma is a collection of blood between the outer wall of the embryo (chorion) and the inner wall of the uterus.  This condition can cause vaginal bleeding.  Sometimes you may have no symptoms and the bleeding may only be seen when ultrasound images are taken.  Treatment may include watchful waiting, medicines, or activity restriction.  Keep all follow-up visits. Get help right away if you have severe cramps or heavy vaginal bleeding. This information is not intended to replace advice given to you by your health care provider. Make sure you discuss any questions you have with your health care provider. Document Revised: 09/07/2020 Document Reviewed: 09/07/2020 Elsevier Patient Education  2021 Elsevier Inc.   

## 2021-05-10 ENCOUNTER — Ambulatory Visit: Payer: BC Managed Care – PPO | Admitting: Clinical

## 2021-05-10 ENCOUNTER — Ambulatory Visit
Admission: RE | Admit: 2021-05-10 | Discharge: 2021-05-10 | Disposition: A | Discharge status: Home / Self Care | Payer: BC Managed Care – PPO | Source: Ambulatory Visit | Attending: Student | Admitting: Student

## 2021-05-10 ENCOUNTER — Telehealth (INDEPENDENT_AMBULATORY_CARE_PROVIDER_SITE_OTHER): Payer: BC Managed Care – PPO

## 2021-05-10 ENCOUNTER — Other Ambulatory Visit: Payer: Self-pay

## 2021-05-10 ENCOUNTER — Ambulatory Visit (INDEPENDENT_AMBULATORY_CARE_PROVIDER_SITE_OTHER): Payer: BC Managed Care – PPO

## 2021-05-10 DIAGNOSIS — Z3A00 Weeks of gestation of pregnancy not specified: Secondary | ICD-10-CM

## 2021-05-10 DIAGNOSIS — Z1331 Encounter for screening for depression: Secondary | ICD-10-CM

## 2021-05-10 DIAGNOSIS — Z1389 Encounter for screening for other disorder: Secondary | ICD-10-CM | POA: Diagnosis not present

## 2021-05-10 DIAGNOSIS — O219 Vomiting of pregnancy, unspecified: Secondary | ICD-10-CM

## 2021-05-10 DIAGNOSIS — Z3490 Encounter for supervision of normal pregnancy, unspecified, unspecified trimester: Secondary | ICD-10-CM

## 2021-05-10 DIAGNOSIS — Z3491 Encounter for supervision of normal pregnancy, unspecified, first trimester: Secondary | ICD-10-CM | POA: Insufficient documentation

## 2021-05-10 MED ORDER — GOJJI WEIGHT SCALE MISC: 1.0000 | 0 refills | Status: AC

## 2021-05-10 MED ORDER — BLOOD PRESSURE KIT DEVI: 1.0000 | Freq: Once | 0 refills | Status: AC

## 2021-05-10 MED ORDER — PROMETHAZINE HCL 25 MG PO TABS: 25.0000 mg | ORAL_TABLET | Freq: Four times a day (QID) | ORAL | 0 refills | Status: DC | PRN

## 2021-05-10 MED ORDER — BLOOD PRESSURE KIT DEVI: 1.0000 | Freq: Once | 0 refills | Status: DC

## 2021-05-10 NOTE — Progress Notes (Signed)
New OB Intake  I connected with  Wilson Singer on 05/10/21 at  9:15 AM EDT by MyChart video and verified that I am speaking with the correct person using two identifiers. Nurse is located at [facility] and pt is located at home.  I discussed the limitations, risks, security and privacy concerns of performing an evaluation and management service by telephone and the availability of in person appointments. I also discussed with the patient that there may be a patient responsible charge related to this service. The patient expressed understanding and agreed to proceed.  I explained I am completing New OB Intake today. We discussed her EDD of 12/08/21 that is based on LMP of 03/03/21. Pt is G2/P0. I reviewed her allergies, medications, Medical/Surgical/OB history, and appropriate screenings. I informed her of [facility] services. Based on history, this is an uncomplicated pregnancy.  Patient Active Problem List   Diagnosis Date Noted  . Supervision of low-risk pregnancy, first trimester 05/10/2021   Concerns addressed today  Nausea: Pt reports continued nausea. Has tried dramamine for nausea and gas-x for bloating. Offered Phenergan rx per protocol; pt agreeable.  Reports intermittent sharp, throbbing pain in pelvic area that lasts for approx. 10 seconds. Most recently occurred last night. Per chart review pt has confirmed IUP and follow up US today. Advised pt to go to MAU for any pain that persists or bleeding like a period.   PHQ-9 and GAD-7 positive today. Pt endorses one episode of SI in the past 2 weeks. Report given to [facility], recommends pt be seen for same day appt. Pt agreeable to appt with Asher Muir today at 3:45 PM.  Delivery Plans:  Plans to deliver at [facility].   MyChart/Babyscripts MyChart access verified. I explained pt will have some visits in office and some virtually. Babyscripts instructions given and order placed.   Blood Pressure Cuff/Weight Scale Blood pressure cuff ordered for  patient to pick-up from [employer]. Weight scale ordered. Explained after first prenatal appt pt will check weekly and document in Babyscripts.  Anatomy US Explained first scheduled [place] will be around 19 weeks. Anatomy US scheduled for 07/14/21 at 0845. Pt notified to arrive at 0830.  Labs Discussed Avelina Laine genetic screening with patient. Would like both Panorama and Horizon drawn at new OB visit. Routine prenatal labs needed.  COVID Vaccine Patient has had COVID vaccine.   Social Determinants of Health . Food Insecurity: Patient denies food insecurity. . WIC Referral: Patient is interested in referral to [facility].  . Transportation: Patient denies transportation needs. . Childcare: Discussed no children allowed at ultrasound appointments. Offered childcare services; patient declines childcare services at this time.  First visit review I reviewed new OB appt with pt. I explained she will have a provider visit that includes a pelvic exam with PAP smear and ob bloodwork with genetic screening. Explained pt will be seen by Tinnie Gens, MD at first visit; encounter routed to appropriate provider. Explained that patient will be seen by pregnancy navigator following visit with provider.  Marjo Bicker, RN 05/10/2021  1:20 PM

## 2021-05-10 NOTE — Progress Notes (Signed)
Pt here today for [place] results. Reviewed results with Venia Carbon, NP  Called pt since pt couldn't wait to be seen. Spoke with pt. Pt given results and advised to keep new OB appt 05/19/21. Pt agreeable and verbalized understanding to plan of care.   Judeth Cornfield, RN  05/10/21

## 2021-05-10 NOTE — BH Specialist Note (Signed)
Error, pt cancelled visit for today

## 2021-05-11 NOTE — Progress Notes (Signed)
Patient seen and assessed by nursing staff.  Agree with documentation and plan.  

## 2021-05-12 NOTE — Progress Notes (Signed)
Chart reviewed for nurse visit. Agree with plan of care.   Venia Carbon I, NP 05/12/2021 1:36 PM

## 2021-05-13 ENCOUNTER — Ambulatory Visit (INDEPENDENT_AMBULATORY_CARE_PROVIDER_SITE_OTHER): Payer: BC Managed Care – PPO | Admitting: Clinical

## 2021-05-13 DIAGNOSIS — F4323 Adjustment disorder with mixed anxiety and depressed mood: Secondary | ICD-10-CM | POA: Diagnosis not present

## 2021-05-13 NOTE — BH Specialist Note (Signed)
Integrated Behavioral Health via Telemedicine Visit  05/13/2021 Gloria Orr [id number]  Number of Integrated Behavioral Health visits: 1 Session Start time: 2:17  Session End time: 2:58 Total time: 56  Referring Provider: Tinnie Gens, MD Patient/Family location: Home [facility] Provider location: Center for [facility] Healthcare at [facility]  All persons participating in visit: Patient Gloria Orr and [facility] Gloria Orr   Types of Service: Individual psychotherapy and Video visit  I connected with Gloria Orr and/or Gloria Orr's n/a via  Telephone or Video Enabled Telemedicine Application  (Video is Caregility application) and verified that I am speaking with the correct person using two identifiers. Discussed confidentiality: Yes   I discussed the limitations of telemedicine and the availability of in person appointments.  Discussed there is a possibility of technology failure and discussed alternative modes of communication if that failure occurs.  I discussed that engaging in this telemedicine visit, they consent to the provision of behavioral healthcare and the services will be billed under their insurance.  Patient and/or legal guardian expressed understanding and consented to Telemedicine visit: Yes   Presenting Concerns: Patient and/or family reports the following symptoms/concerns: Pt states her primary symptoms today are fatigue, poor appetite, anhedonia, irritability and nausea, and  worries that she has lost weight in pregnancy; pt feels best when she is organized and able to accomplish things. Pt denies SI, no intent, no plan.  Duration of problem: Ongoing with slight increase in pregnancy; Severity of problem: moderately severe  Patient and/or Family's Strengths/Protective Factors: Social connections, Social and Emotional competence and Concrete supports in place (healthy food, safe environments, etc.)  Goals Addressed: Patient will: 1.  Reduce  symptoms of: anxiety, depression and stress  2.  Increase knowledge and/or ability of: healthy habits and self-management skills  3.  Demonstrate ability to: Increase healthy adjustment to current life circumstances  Progress towards Goals: Ongoing  Interventions: Interventions utilized:  CBT Cognitive Behavioral Therapy and Psychoeducation and/or Health Education Standardized Assessments completed: Not Needed  Patient and/or Family Response: Pt agrees to treatment plan  Assessment: Patient currently experiencing Adjustment disorder with mixed anxious and depressed mood.   Patient may benefit from psychoeducation and brief therapeutic interventions regarding coping with symptoms of anxiety and depression .  Plan: 1. Follow up with behavioral health clinician on : Two weeks; Call Gloria Orr if needed prior to scheduled visit at [phone] 2. Behavioral recommendations:  -Continue taking prenatal vitamin daily  -Begin Worry Time strategy; implement daily for two weeks(Set timer for start and end time and obtain notebook to use today) -Continue to prioritize healthy sleep 3. Referral(s): Integrated [employer] (In Clinic)  I discussed the assessment and treatment plan with the patient and/or parent/guardian. They were provided an opportunity to ask questions and all were answered. They agreed with the plan and demonstrated an understanding of the instructions.   They were advised to call back or seek an in-person evaluation if the symptoms worsen or if the condition fails to improve as anticipated.  Gloria Lips, LCSW   Depression screen [facility] 2/9 05/10/2021 06/01/2020 06/01/2020 05/22/2020  Decreased Interest 3 1 1  0  Down, Depressed, Hopeless 1 1 1 1   PHQ - 2 Score 4 2 2 1   Altered sleeping 1 0 1 0  Tired, decreased energy 3 1 1 1   Change in appetite 3 3 3 3   Feeling bad or failure about yourself  1 1 - 1  Trouble concentrating 0 1 0 0  Moving  slowly or  fidgety/restless 0 0 0 0  Suicidal thoughts 1 0 0 0  PHQ-9 Score 13 8 7 6   Difficult doing work/chores - - Somewhat difficult -   GAD 7 : Generalized Anxiety Score 05/10/2021 06/01/2020 05/22/2020  Nervous, Anxious, on Edge 0 0 0  Control/stop worrying 1 1 0  Worry too much - different things 2 1 2   Trouble relaxing 2 0 1  Restless 0 0 0  Easily annoyed or irritable 3 0 1  Afraid - awful might happen 2 0 0  Total GAD 7 Score 10 2 4

## 2021-05-13 NOTE — Patient Instructions (Signed)
Center for [facility] Healthcare at [facility] for [facility] [address] [place], [place] [address] [phone] (main office) [phone] [facility] office)   [email or web address] (video tour of [facility]; register for childbirth education classes, etc.)

## 2021-05-14 NOTE — BH Specialist Note (Signed)
Integrated Behavioral Health via Telemedicine Visit  05/14/2021 Lamica Mccart [id number]  Number of Integrated Behavioral Health visits: 2 Session Start time: 3:38  Session End time: 3:51 Total time: 13  Referring Provider: Tinnie Gens, MD Patient/Family location: Home [facility] Provider location: Center for [facility] Healthcare at [facility] for [facility]  All persons participating in visit: Patient Gloria Orr and [facility] Sylas Twombly   Types of Service: Individual psychotherapy and Video visit  I connected with Gloria Orr and/or Gloria Orr's n/a via  Telephone or Video Enabled Telemedicine Application  (Video is Caregility application) and verified that I am speaking with the correct person using two identifiers. Discussed confidentiality: Yes   I discussed the limitations of telemedicine and the availability of in person appointments.  Discussed there is a possibility of technology failure and discussed alternative modes of communication if that failure occurs.  I discussed that engaging in this telemedicine visit, they consent to the provision of behavioral healthcare and the services will be billed under their insurance.  Patient and/or legal guardian expressed understanding and consented to Telemedicine visit: Yes   Presenting Concerns: Patient and/or family reports the following symptoms/concerns: Pt states her appetite has increased; nausea, irritability, fatigue are all decreasing, and mood feels much improved; no other questions or concerns at this time.  Duration of problem: Current pregnancy; Severity of problem: mild  Patient and/or Family's Strengths/Protective Factors: Social connections, Concrete supports in place (healthy food, safe environments, etc.) and Sense of purpose  Goals Addressed: Patient will: 1.  Maintain reduction of symptoms of: anxiety, depression and stress  2.  Demonstrate ability to: Increase healthy adjustment to current life  circumstances  Progress towards Goals: Ongoing  Interventions: Interventions utilized:  Supportive Counseling Standardized Assessments completed: GAD-7 and PHQ 9  Patient and/or Family Response: Pt agrees with treatment plan  Assessment: Patient currently experiencing Adjustment disorder with mixed anxious and depressed mood.   Patient may benefit from supportive counseling today.  Plan: 1. Follow up with behavioral health clinician on : Call Asher Muir as needed if symptoms increase, at [phone] 2. Behavioral recommendations:  -Continue taking prenatal vitamin daily as prescribed -Continue prioritizing healthy self-care through healthy sleeping and eating -Consider additional resources listed on After Visit Summary as needed 3. Referral(s): Integrated [employer] (In Clinic)  I discussed the assessment and treatment plan with the patient and/or parent/guardian. They were provided an opportunity to ask questions and all were answered. They agreed with the plan and demonstrated an understanding of the instructions.   They were advised to call back or seek an in-person evaluation if the symptoms worsen or if the condition fails to improve as anticipated.  Rae Lips, LCSW   Depression screen [facility] 2/9 05/27/2021 05/10/2021 06/01/2020 06/01/2020 05/22/2020  Decreased Interest 1 3 1 1  0  Down, Depressed, Hopeless 0 1 1 1 1   PHQ - 2 Score 1 4 2 2 1   Altered sleeping 0 1 0 1 0  Tired, decreased energy 0 3 1 1 1   Change in appetite 1 3 3 3 3   Feeling bad or failure about yourself  0 1 1 - 1  Trouble concentrating 0 0 1 0 0  Moving slowly or fidgety/restless 0 0 0 0 0  Suicidal thoughts 0 1 0 0 0  PHQ-9 Score 2 13 8 7 6   Difficult doing work/chores - - - Somewhat difficult -   GAD 7 : Generalized Anxiety Score 05/27/2021 05/10/2021 06/01/2020 05/22/2020  Nervous, Anxious, on Edge  0 0 0 0  Control/stop worrying 1 1 1  0  Worry too much - different things 1 2 1 2   Trouble  relaxing 0 2 0 1  Restless 0 0 0 0  Easily annoyed or irritable 0 3 0 1  Afraid - awful might happen 0 2 0 0  Total GAD 7 Score 2 10 2  4

## 2021-05-19 ENCOUNTER — Encounter: Payer: BC Managed Care – PPO | Admitting: Family Medicine

## 2021-05-27 ENCOUNTER — Ambulatory Visit (INDEPENDENT_AMBULATORY_CARE_PROVIDER_SITE_OTHER): Payer: BC Managed Care – PPO | Admitting: Clinical

## 2021-05-27 DIAGNOSIS — F418 Other specified anxiety disorders: Secondary | ICD-10-CM | POA: Diagnosis not present

## 2021-05-27 DIAGNOSIS — Z658 Other specified problems related to psychosocial circumstances: Secondary | ICD-10-CM | POA: Diagnosis not present

## 2021-05-27 DIAGNOSIS — F4323 Adjustment disorder with mixed anxiety and depressed mood: Secondary | ICD-10-CM

## 2021-05-27 NOTE — Patient Instructions (Signed)
[facility] for [facility] at [facility] [place], [place] [address] [phone] (main office) [phone] [facility] office)   [email or web address] Administrator for childbirth education classes, etc)     BRAINSTORMING  Develop a Plan Goals: . Provide a way to start conversation about your new life with a baby . Assist parents in recognizing and using resources within their reach . Help pave the way before birth for an easier period of transition afterwards.  Make a list of the following information to keep in a central location: . Full name of Mom and Partner: _____________________________________________ . 9 full name and Date of Birth: ___________________________________________ . Home Address: ___________________________________________________________ ________________________________________________________________________ . Home Phone: ____________________________________________________________ . Parents' cell numbers: _____________________________________________________ ________________________________________________________________________ . Name and contact info for OB: ______________________________________________ . Name and contact info for Pediatrician:________________________________________ . Contact info for Lactation Consultants: ________________________________________  REST and SLEEP *You each need at least 4-5 hours of uninterrupted sleep every day. Write specific names and contact information.* . How are you going to rest in the postpartum period? While partner's home? When partner returns to work? When you both return to work? Marland Kitchen Where will your baby sleep? Marland Kitchen Who is available to help during the day? Evening? Night? . Who could move in for a period to help support you? Marland Kitchen What are some ideas to help you get enough  sleep? __________________________________________________________________________________________________________________________________________________________________________________________________________________________________________ NUTRITIOUS FOOD AND DRINK *Plan for meals before your baby is born so you can have healthy food to eat during the immediate postpartum period.* . Who will look after breakfast? Lunch? Dinner? List names and contact information. Brainstorm quick, healthy ideas for each meal. . What can you do before baby is born to prepare meals for the postpartum period? . How can others help you with meals? Marland Kitchen Which grocery stores provide online shopping and delivery? Marland Kitchen Which restaurants offer take-out or delivery options? ______________________________________________________________________________________________________________________________________________________________________________________________________________________________________________________________________________________________________________________________________________________________________________________________________  CARE FOR MOM *It's important that mom is cared for and pampered in the postpartum period. Remember, the most important ways new mothers need care are: sleep, nutrition, gentle exercise, and time off.* . Who can come take care of mom during this period? Make a list of people with their contact information. . List some activities that make you feel cared for, rested, and energized? Who can make sure you have opportunities to do these things? . Does mom have a space of her very own within your home that's just for her? Make a "[facility]" where she can be comfortable, rest, and renew herself  daily. ______________________________________________________________________________________________________________________________________________________________________________________________________________________________________________________________________________________________________________________________________________________________________________________________________    CARE FOR AND FEEDING BABY *Knowledgeable and encouraging people will offer the best support with regard to feeding your baby.* . Educate yourself and choose the best feeding option for your baby. . Make a list of people who will guide, support, and be a resource for you as your care for and feed your baby. (Friends that have breastfed or are currently breastfeeding, lactation consultants, breastfeeding support groups, etc.) . Consider a postpartum doula. (These websites can give you information: dona.org & BuyingShow.es) . Seek out local breastfeeding resources like the breastfeeding support group at [employer] or [employer]. ______________________________________________________________________________________________________________________________________________________________________________________________________________________________________________________________________________________________________________________________________________________________________________________________________  Verner Chol AND ERRANDS . Who can help with a thorough cleaning before baby is born? . Make a list of people who will help with housekeeping and chores, like laundry, light cleaning, dishes, bathrooms, etc. . Who can run some errands for you? Marland Kitchen What can you do to make sure you are stocked with basic supplies before baby  is born? . Who is going to do the  shopping? ______________________________________________________________________________________________________________________________________________________________________________________________________________________________________________________________________________________________________________________________________________________________________________________________________     Family Adjustment *Nurture yourselves.it helps parents be more loving and allows for better bonding with their child.* . What sorts of things do you and partner enjoy doing together? Which activities help you to connect and strengthen your relationship? Make a list of those things. Make a list of people whom you trust to care for your baby so you can have some time together as a couple. . What types of things help partner feel connected to Mom? Make a list. . What needs will partner have in order to bond with baby? . Other children? Who will care for them when you go into labor and while you are in the hospital? . Think about what the needs of your older children might be. Who can help you meet those needs? In what ways are you helping them prepare for bringing baby home? List some specific strategies you have for family adjustment. _______________________________________________________________________________________________________________________________________________________________________________________________________________________________________________________________________________________________________________________________________________  SUPPORT *Someone who can empathize with experiences normalizes your problems and makes them more bearable.* . Make a list of other friends, neighbors, and/or co-workers you know with infants (and small children, if applicable) with whom you can connect. . Make a list of local or online support groups, mom groups, etc. in which you can be  involved. ______________________________________________________________________________________________________________________________________________________________________________________________________________________________________________________________________________________________________________________________________________________________________________________________________  Childcare Plans . Investigate and plan for childcare if mom is returning to work. . Talk about mom's concerns about her transition back to work. . Talk about partner's concerns regarding this transition.  Mental Health *Your mental health is one of the highest priorities for a pregnant or postpartum mom.* . 1 in 5 women experience anxiety and/or depression from the time of conception through the first year after birth. . Postpartum Mood Disorders are the #1 complication of pregnancy and childbirth and the suffering experienced by these mothers is not necessary! These illnesses are temporary and respond well to treatment, which often includes self-care, social support, talk therapy, and medication when needed. . Women experiencing anxiety and depression often say things like: "I'm supposed to be happy.why do I feel so sad?", "Why can't I snap out of it?", "I'm having thoughts that scare me." . There is no need to be embarrassed if you are feeling these symptoms: o Overwhelmed, anxious, angry, sad, guilty, irritable, hopeless, exhausted but can't sleep o You are NOT alone. You are NOT to blame. With help, you WILL be well. . Where can I find help? Medical professionals such as your OB, midwife, gynecologist, family practitioner, primary care provider, pediatrician, or mental health providers; [facility] support groups: Feelings After Birth, Breastfeeding Support Group, Baby and Me Group, and Fit 4 Two exercise classes. . You have permission to ask for help. It will confirm your feelings, validate your  experiences, share/learn coping strategies, and gain support and encouragement as you heal. You are important! BRAINSTORM . Make a list of local resources, including resources for mom and for partner. . Identify support groups. . Identify people to call late at night - include names and contact info. . Talk with partner about perinatal mood and anxiety disorders. . Talk with your OB, midwife, and doula about baby blues and about perinatal mood and anxiety disorders. . Talk with your pediatrician about perinatal mood and anxiety disorders.   Support & San[employer]vers   What do you really need?  . Basics . In preparing for a new baby, many expectant parents spend hours shopping for baby  clothes, decorating the nursery, and deciding which car seat to buy. Yet most don't think much about what the reality of parenting a newborn will be like, and what they need to make it through that. So, here is the advice of experienced parents. We know you'll read this, and think "they're exaggerating, I don't really need that." Just trust [place] on these, OK? Plan for all of this, and if it turns out you don't need it, come back and teach [place] how you did it!  Satira Anis (Once baby's survival needs are met, make sure you attend to your own survival needs!) . Sleep . An average newborn sleeps 16-18 hours per day, over 6-7 sleep periods, rarely more than three hours at a time. It is normal and healthy for a newborn to wake throughout the night... but really hard on parents!! . Naps. Prioritize sleep above any responsibilities like: cleaning house, visiting friends, running errands, etc.  Sleep whenever baby sleeps. If you can't nap, at least have restful times when baby eats. The more rest you get, the more patient you will be, the more emotionally stable, and better at solving problems.  . Food . You may not have realized it would be difficult to eat when you have a newborn. Yet, when we talk to . countless new  parents, they say things like "it may be 2:00 pm when I realize I haven't had breakfast yet." Or "every time we sit down to dinner, baby needs to eat, and my food gets cold, so I don't bother to eat it." . Finger food. Before your baby is born, stock up with one months' worth of food that: 1) you can eat with one hand while holding a baby, 2) doesn't need to be prepped, 3) is good hot or cold, 4) doesn't spoil when left out for a few hours, and 5) you like to eat. Think about: nuts, dried fruit, Clif bars, pretzels, jerky, gogurt, baby carrots, apples, bananas, crackers, cheez-n-crackers, string cheese, hot pockets or frozen burritos to microwave, garden burgers and breakfast pastries to put in the toaster, yogurt drinks, etc. . Restaurant Menus. Make lists of your favorite restaurants & menu items. When family/friends want to help, you can give specific information without much thought. They can either bring you the food or send gift cards for just the right meals. Rosaura Carpenter Meals.  Take some time to make a few meals to put in the freezer ahead of time.  Easy to freeze meals can be anything such as soup, lasagna, chicken pie, or spaghetti sauce. . Set up a Meal Schedule.  Ask friends and family to sign up to bring you meals during the first few weeks of being home. (It can be passed around at baby showers!) You have no idea how helpful this will be until you are in the throes of parenting.  https://hamilton-woodard.[email or web address] is a great website to check out. . Emotional Support . Know who to call when you're stressed out. Parenting a newborn is very challenging work. There are times when it totally overwhelms your normal coping abilities. EVERY NEW PARENT NEEDS TO HAVE A PLAN FOR WHO TO CALL WHEN THEY JUST CAN'T COPE ANY MORE. (And it has to be someone other than the baby's other parent!) Before your baby is born, come up with at least one person you can call for support - write their phone number down and post it on the  refrigerator. Marland Kitchen Anxiety & Sadness. Baby blues are normal  after pregnancy; however, there are more severe types of anxiety & sadness which can occur and should not be ignored.  They are always treatable, but you have to take the first step by reaching out for help. [facility] offers a "Mom Talk" group which meets every Tuesday from 10 am - 11 am.  This group is for new moms who need support and connection after their babies are born.  Call [phone].  Marland Kitchen Really, Really Helpful (Plan for them! Make sure these happen often!!) . Physical Support with Taking Care of Yourselves . Asking friends and family. Before your baby is born, set up a schedule of people who can come and visit and help out (or ask a friend to schedule for you). Any time someone says "let me know what I can do to help," sign them up for a day. When they get there, their job is not to take care of the baby (that's your job and your joy). Their job is to take care of you!  . Postpartum doulas. If you don't have anyone you can call on for support, look into postpartum doulas:  professionals at helping parents with caring for baby, caring for themselves, getting breastfeeding started, and helping with household tasks. www.padanc.org is a helpful website for learning about doulas in our area. . Peer Support / Parent Groups . Why: One of the greatest ideas for new parents is to be around other new parents. Parent groups give you a chance to share and listen to others who are going through the same season of life, get a sense of what is normal infant development by watching several babies learn and grow, share your stories of triumph and struggles with empathetic ears, and forgive your own mistakes when you realize all parents are learning by trial and error. . Where to find: There are many places you can meet other new parents throughout our community.  [facility] offers the following classes for new moms and their little ones:  Baby  and Me (Birth to [place]) and Breastfeeding Support Group. Go to www.conehealthybaby.com or call [phone] for more information. . Time for your Relationship . It's easy to get so caught up in meeting baby's immediate needs that it's hard to find time to connect with your partner, and meet the needs of your relationship. It's also easy to forget what "quality time with your partner" actually looks like. If you take your baby on a date, you'd be amazed how much of your couple time is spent feeding the baby, diapering the baby, admiring the baby, and talking about the baby. . Dating: Try to take time for just the two of you. Babysitter tip: Sometimes when moms are breastfeeding a newborn, they find it hard to figure out how to schedule outings around baby's unpredictable feeding schedules. Have the babysitter come for a three hour period. When she comes over, if baby has just eaten, you can leave right away, and come back in two hours. If baby hasn't fed recently, you start the date at home. Once baby gets hungry and gets a good feeding in, you can head out for the rest of your date time. . Date Nights at Home: If you can't get out, at least set aside one evening a week to prioritize your relationship: whenever baby dozes off or doesn't have any immediate needs, spend a little time focusing on each other. . Potential conflicts: The main relationship conflicts that come up for new parents are: issues  related to sexuality, financial stresses, a feeling of an unfair division of household tasks, and conflicts in parenting styles. The more you can work on these issues before baby arrives, the better!  Clint Guy and Frills (Don't forget these. and don't feel guilty for indulging in them!) . Everyone has something in life that is a fun little treat that they do just for themselves. It may be: reading the morning paper, or going for a daily jog, or having coffee with a friend once a week, or going to a movie on Friday  nights, or fine chocolates, or bubble baths, or curling up with a good book. . Unless you do fun things for yourself every now and then, it's hard to have the energy for fun with your baby. Whatever your "special" treats are, make sure you find a way to continue to indulge in them after your baby is born. These special moments can recharge you, and allow you to return to baby with a new joy   PERINATAL MOOD DISORDERS: [place]   Emergency and Crisis Resources:  If you are an imminent risk to self or others, are experiencing intense personal distress, and/or have noticed significant changes in activities of daily living, call:  . 911 . [facility]: [phone] . Mobile Crisis: [phone] . National Suicide Hotline: (267) 132-4026 Or visit the following crisis centers: . Local Emergency Departments . [facility]: [address], [place]. Hours: 8:30AM-5PM. Insurance Accepted: Medicaid, Medicare, and Uninsured.  Marland Kitchen [facility]  [address], [place] Mon-Friday 8am-3pm  [phone]                                                                                    Non-Crisis Resources: To identify specific providers that are covered by your insurance, contact your insurance company or local agencies: [place]: 6168098846 [facility] [facility] [place]: [phone] [place]: [phone] Postpartum Support International- [place] 1-503-581-5376                                                      Outpatient therapy and medication management providers:  [facility] [phone] Hours: 9AM-5PM  Insurance Accepted: Alben Spittle, Lorella Nimrod, Freddrick March, [place], Medicare  [facility]) [phone] Hours: 8AM-5PM  nsurance Accepted: All insurances EXCEPT AARP, [place], [place], and  [place]: [phone]             Hours: 8AM-8PM Insurance Accepted: Cristal Ford, Freddrick March, [place], Medicare, [phone] [facility]: [phone] Hours: 8:30AM-7PM Insurance Accepted: Cristal Ford, Medicaid, Medicare, Tricare, [employer] Counseling:  [place] Accepted:  Holland Falling, Lorella Nimrod, [employer], [employer], [facility] [phone] Hours: 9AM-5:30PM Insurance Accepted: Alben Spittle, [place], [place], and Medicaid, Medicare, [employer] Counseling:  [phone] Hours: 9am-5pm Insurance Accepted: [place]; they do not accept Medicaid/Medicare [place]: [phone] Hours: 9am-9pm Insurance Accepted: All major insurance including Medicaid and Medicare [facility]: [phone] Hours: 9AM-5:30PM Insurance Accepted: All insurances EXCEPT Medicaid and Medicare. [facility]: [place]: [phone] [place]:  [place] (support for children in the NICU and/or with special needs), [employer]: [phone]                                                                                     Online Resources: Postpartum Support International: http://jones-berg.com/  800-944-4PPD 2Moms Supporting Moms:  www.momssupportingmoms.net

## 2021-06-02 ENCOUNTER — Ambulatory Visit (INDEPENDENT_AMBULATORY_CARE_PROVIDER_SITE_OTHER): Payer: BC Managed Care – PPO | Admitting: Certified Nurse Midwife

## 2021-06-02 ENCOUNTER — Other Ambulatory Visit: Payer: Self-pay

## 2021-06-02 ENCOUNTER — Other Ambulatory Visit (HOSPITAL_COMMUNITY)
Admission: RE | Admit: 2021-06-02 | Discharge: 2021-06-02 | Disposition: A | Discharge status: Home / Self Care | Payer: BC Managed Care – PPO | Source: Ambulatory Visit | Attending: Family Medicine | Admitting: Family Medicine

## 2021-06-02 ENCOUNTER — Encounter: Payer: Self-pay | Admitting: Certified Nurse Midwife

## 2021-06-02 VITALS — BP 114/78 | HR 85 | Wt 117.5 lb

## 2021-06-02 DIAGNOSIS — Z3491 Encounter for supervision of normal pregnancy, unspecified, first trimester: Secondary | ICD-10-CM | POA: Diagnosis present

## 2021-06-02 DIAGNOSIS — Z3A13 13 weeks gestation of pregnancy: Secondary | ICD-10-CM

## 2021-06-02 NOTE — Progress Notes (Signed)
Patient stated that she has been having sharp abdominal pain (right rid) for about 3 days now. Denies any vaginal bleeding. Gloria Orr also stated that when she urinates she notices a white substance in urine for about a week

## 2021-06-03 ENCOUNTER — Encounter: Payer: Self-pay | Admitting: *Deleted

## 2021-06-03 LAB — CBC/D/PLT+RPR+RH+ABO+RUBIGG...
Antibody Screen: NEGATIVE
Basophils Absolute: 0 10*3/uL (ref 0.0–0.2)
Basos: 1 %
EOS (ABSOLUTE): 0 10*3/uL (ref 0.0–0.4)
Eos: 1 %
HCV Ab: 0.1 s/co ratio (ref 0.0–0.9)
HIV Screen 4th Generation wRfx: NONREACTIVE
Hematocrit: 37.1 % (ref 34.0–46.6)
Hemoglobin: 12.5 g/dL (ref 11.1–15.9)
Hepatitis B Surface Ag: NEGATIVE
Immature Grans (Abs): 0 10*3/uL (ref 0.0–0.1)
Immature Granulocytes: 0 %
Lymphocytes Absolute: 1.3 10*3/uL (ref 0.7–3.1)
Lymphs: 24 %
MCH: 28 pg (ref 26.6–33.0)
MCHC: 33.7 g/dL (ref 31.5–35.7)
MCV: 83 fL (ref 79–97)
Monocytes Absolute: 0.5 10*3/uL (ref 0.1–0.9)
Monocytes: 9 %
Neutrophils Absolute: 3.5 10*3/uL (ref 1.4–7.0)
Neutrophils: 65 %
Platelets: 281 10*3/uL (ref 150–450)
RBC: 4.46 x10E6/uL (ref 3.77–5.28)
RDW: 13.8 % (ref 11.7–15.4)
RPR Ser Ql: NONREACTIVE
Rh Factor: POSITIVE
Rubella Antibodies, IGG: 19.9 index (ref >0.99)
WBC: 5.3 10*3/uL (ref 3.4–10.8)

## 2021-06-03 LAB — HEMOGLOBIN A1C
Est. average glucose Bld gHb Est-mCnc: 88 mg/dL
Hgb A1c MFr Bld: 4.7 % — ABNORMAL LOW (ref 4.8–5.6)

## 2021-06-03 LAB — HCV INTERPRETATION

## 2021-06-04 LAB — CULTURE, OB URINE

## 2021-06-04 LAB — CYTOLOGY - PAP: Diagnosis: NEGATIVE

## 2021-06-04 LAB — URINE CULTURE, OB REFLEX

## 2021-06-04 NOTE — Progress Notes (Signed)
History:   Gloria Orr is a 20 y.o. G2P0010 at [redacted]w[redacted]d by LMP being seen today for her first obstetrical visit.  Her obstetrical history is significant for  none . Patient does intend to breast feed. Pregnancy history fully reviewed.  Patient reports  some pain on the lower right side of her abdomen/pelvis for the past 3 days, as well as some white vaginal discharge .   Was seen in [facility] in April/May, records reviewed.  Pertinent items noted in HPI and remainder of comprehensive ROS otherwise negative.   HISTORY: OB History  Gravida Para Term Preterm AB Living  2 0 0 0 1 0  SAB IAB Ectopic Multiple Live Births  1 0 0 0 0    # Outcome Date GA Lbr Len/2nd Weight Sex Delivery Anes PTL Lv  2 Current           1 SAB 05/14/20 [redacted]w[redacted]d           Has not ever had a pap smear, will collect today  Past Medical History:  Diagnosis Date   Chronic headaches    Depression with suicidal ideation    Eczema    Seborrheic keratosis of scalp    Sinusitis    Stress incontinence    Past Surgical History:  Procedure Laterality Date   NO PAST SURGERIES     Family History  Problem Relation Age of Onset   Asthma Mother    Hypertension Father    Diabetes Father    Heart disease Sister    Asthma Sister    Asthma Brother    Social History   Tobacco Use   Smoking status: Never   Smokeless tobacco: Never  Vaping Use   Vaping Use: Never used  Substance Use Topics   Alcohol use: Not Currently   Drug use: Never   No Known Allergies Current Outpatient Medications on File Prior to Visit  Medication Sig Dispense Refill   dimenhyDRINATE (DRAMAMINE) 50 MG tablet Take 50 mg by mouth every 8 (eight) hours as needed.     Misc. Devices (GOJJI WEIGHT SCALE) MISC 1 Device by Does not apply route once a week. Please check weight once a week and record in Babyscripts. 1 each 0   Prenatal Vit-Fe Fumarate-FA (PRENATAL VITAMINS PO) Take 1 tablet by mouth daily.     Simethicone (GAS-X PO) Take by mouth.      promethazine (PHENERGAN) 25 MG tablet Take 1 tablet (25 mg total) by mouth every 6 (six) hours as needed for nausea or vomiting. (Patient not taking: Reported on 06/02/2021) 30 tablet 0   No current facility-administered medications on file prior to visit.    Review of Systems Pertinent items noted in HPI and remainder of comprehensive ROS otherwise negative. Physical Exam:   Vitals:   06/02/21 1410  BP: 114/78  Pulse: 85  Weight: 117 lb 8 oz (53.3 kg)   Fetal Heart Rate (bpm): 147  Constitutional: Well-developed, well-nourished pregnant female in no acute distress.  HEENT: PERRLA Skin: normal color and turgor, no rash Cardiovascular: normal rate & rhythm, no murmur Respiratory: normal effort, lung sounds clear throughout GI: Abd soft, non-tender, pos BS x 4, gravid appropriate for gestational age MS: Extremities nontender, no edema, normal ROM Neurologic: Alert and oriented x 4.  GU: no CVA tenderness Pelvic: NEFG, physiologic discharge, no blood, cervix clean. Pap/swabs collected   Assessment & Plan:  1. Supervision of low-risk pregnancy, first trimester - Doing well, not feeling fetal movement yet.  Pain seems to be combo of round ligament + corpus luteum (noted to be on right side per last U/S). Advised stretching and massage to ease pain - CBC/D/Plt+RPR+Rh+ABO+RubIgG... - Genetic Screening - Culture, OB Urine - Hemoglobin A1c - Cytology - PAP( [place])  2. [redacted] weeks gestation of pregnancy Routine new OB care including: - Initial labs drawn. - Continue prenatal vitamins. - Problem list reviewed and updated. - Genetic Screening discussed, First trimester screen, Quad screen, and NIPS: ordered. - Ultrasound discussed; fetal anatomic survey: ordered. - Anticipatory guidance about prenatal visits given including labs, ultrasounds, and testing. - Discussed usage of Babyscripts and virtual visits as additional source of managing and completing prenatal visits in midst  of coronavirus and pandemic.   - Encouraged to complete MyChart Registration for her ability to review results, send requests, and have questions addressed.  - The nature of [facility] with multiple MDs and Advanced Practice Providers was explained to patient; also emphasized that residents, students are part of our team. - Routine obstetric precautions reviewed. Encouraged to seek out care at office or emergency room [facility] preferred) for urgent and/or emergent concerns. Return in about 4 weeks (around 06/30/2021) for IN-PERSON, LOB.     Edd Arbour, MSN, CNM, IBCLC Certified Nurse Midwife, [facility]

## 2021-06-10 ENCOUNTER — Other Ambulatory Visit: Payer: Self-pay | Admitting: Obstetrics & Gynecology

## 2021-06-11 ENCOUNTER — Ambulatory Visit (INDEPENDENT_AMBULATORY_CARE_PROVIDER_SITE_OTHER): Payer: BC Managed Care – PPO

## 2021-06-11 ENCOUNTER — Other Ambulatory Visit: Payer: Self-pay

## 2021-06-11 DIAGNOSIS — R8290 Unspecified abnormal findings in urine: Secondary | ICD-10-CM

## 2021-06-11 NOTE — Progress Notes (Signed)
Pt here today with c/o cloudy urine and her urine being dark colored.  Pt denies burning with urination, frequency, urgency, or pain.  UA resulted ketones 15 and trace of leukocytes.  I informed pt that it looks like she does not have a uti however I will send urine for culture just to make sure.  I informed pt that her supplements, diet, and pregnancy itself due to the hormones can cause a change in her urine.  I advised pt that if anything comes back abnormal we will call her.  Pt verbalized understanding.   Leonette Nutting  06/11/21

## 2021-06-14 LAB — POCT URINALYSIS DIP (DEVICE)
Bilirubin Urine: NEGATIVE
Glucose, UA: NEGATIVE mg/dL
Hgb urine dipstick: NEGATIVE
Ketones, ur: 15 mg/dL — AB
Nitrite: NEGATIVE
Protein, ur: NEGATIVE mg/dL
Specific Gravity, Urine: 1.02 (ref 1.005–1.030)
Urobilinogen, UA: 0.2 mg/dL (ref 0.0–1.0)
pH: 7.5 (ref 5.0–8.0)

## 2021-06-14 NOTE — Progress Notes (Signed)
Chart reviewed for nurse visit. Agree with plan of care.   Marny Lowenstein, PA-C 06/14/2021 3:49 PM

## 2021-06-16 LAB — CULTURE, OB URINE

## 2021-06-16 LAB — URINE CULTURE, OB REFLEX: Organism ID, Bacteria: NO GROWTH

## 2021-06-23 ENCOUNTER — Other Ambulatory Visit: Payer: Self-pay

## 2021-06-23 ENCOUNTER — Ambulatory Visit: Payer: BC Managed Care – PPO | Attending: Family Medicine

## 2021-06-23 DIAGNOSIS — Z3491 Encounter for supervision of normal pregnancy, unspecified, first trimester: Secondary | ICD-10-CM | POA: Insufficient documentation

## 2021-06-24 ENCOUNTER — Other Ambulatory Visit: Payer: Self-pay | Admitting: *Deleted

## 2021-06-24 DIAGNOSIS — Z362 Encounter for other antenatal screening follow-up: Secondary | ICD-10-CM

## 2021-06-29 ENCOUNTER — Ambulatory Visit (INDEPENDENT_AMBULATORY_CARE_PROVIDER_SITE_OTHER): Payer: BC Managed Care – PPO | Admitting: Student

## 2021-06-29 ENCOUNTER — Other Ambulatory Visit: Payer: Self-pay

## 2021-06-29 VITALS — BP 103/63 | HR 85

## 2021-06-29 DIAGNOSIS — O351XX0 Maternal care for (suspected) chromosomal abnormality in fetus, not applicable or unspecified: Secondary | ICD-10-CM

## 2021-06-29 DIAGNOSIS — Z148 Genetic carrier of other disease: Secondary | ICD-10-CM

## 2021-06-29 DIAGNOSIS — Z3491 Encounter for supervision of normal pregnancy, unspecified, first trimester: Secondary | ICD-10-CM

## 2021-06-29 DIAGNOSIS — O3510X0 Maternal care for (suspected) chromosomal abnormality in fetus, unspecified, not applicable or unspecified: Secondary | ICD-10-CM | POA: Insufficient documentation

## 2021-06-29 DIAGNOSIS — Z3A16 16 weeks gestation of pregnancy: Secondary | ICD-10-CM

## 2021-06-29 DIAGNOSIS — Z3492 Encounter for supervision of normal pregnancy, unspecified, second trimester: Secondary | ICD-10-CM

## 2021-06-29 NOTE — Progress Notes (Signed)
   PRENATAL VISIT NOTE  Subjective:  Gloria Orr is a 20 y.o. G2P0010 at [redacted]w[redacted]d being seen today for ongoing prenatal care.  She is currently monitored for the following issues for this low-risk pregnancy and has Supervision of low-risk pregnancy, first trimester on their problem list.  Patient reports no complaints. She is traveling to [place] tomorrow.  Patient has not seen her genetic testing results post on MyChart yet or on [facility]; she would like to know what the results are (except sex of baby).  Contractions: Not present. Vag. Bleeding: None.  Movement: Absent. Denies leaking of fluid.   The following portions of the patient's history were reviewed and updated as appropriate: allergies, current medications, past family history, past medical history, past social history, past surgical history and problem list.   Objective:   Vitals:   06/29/21 1453  BP: 103/63  Pulse: 85    Fetal Status: Fetal Heart Rate (bpm): 135   Movement: Absent     General:  Alert, oriented and cooperative. Patient is in no acute distress.  Skin: Skin is warm and dry. No rash noted.   Cardiovascular: Normal heart rate noted  Respiratory: Normal respiratory effort, no problems with respiration noted  Abdomen: Soft, gravid, appropriate for gestational age.  Pain/Pressure: Present     Pelvic: Cervical exam deferred        Extremities: Normal range of motion.  Edema: None  Mental Status: Normal mood and affect. Normal behavior. Normal judgment and thought content.   Assessment and Plan:  Pregnancy: G2P0010 at [redacted]w[redacted]d 1. Supervision of low-risk pregnancy, first trimester -keep [place] appt on the 15 of August -AFP drawn today; explained difference between AFP and NIPs -Long conversation with patient about genetic testing results. Recommended that patient call [facility] to inquire more about the results, as I am not familiar with "atypical results". Per [facility] report, redraw is NOT recommended -genetic counseling  appointment made for patient as well -consider partner testing in the future, but will wait till after genetic counseling reults -all questions answered; patient leaves for [place] tomorrow and will be back on Aug 13.    Preterm labor symptoms and general obstetric precautions including but not limited to vaginal bleeding, contractions, leaking of fluid and fetal movement were reviewed in detail with the patient. Please refer to After Visit Summary for other counseling recommendations.   Return in about 6 weeks (around 08/09/2021), or Aug 15 or later with KK.  Future Appointments  Date Time Provider Department Center  08/09/2021  2:00 PM [facility] NURSE [facility]  08/09/2021  2:15 PM [facility] US2 [facility]  08/10/2021  3:00 PM [facility] GENETIC COUNSELING RM [facility]  08/18/2021  2:15 PM [facility]    Marylene Land, [place]

## 2021-06-29 NOTE — Progress Notes (Signed)
[phone] 

## 2021-07-01 ENCOUNTER — Encounter: Payer: Self-pay | Admitting: *Deleted

## 2021-07-01 LAB — AFP, SERUM, OPEN SPINA BIFIDA
AFP MoM: 0.9
AFP Value: 40.1 ng/mL
Gest. Age on Collection Date: 16.6 weeks
Maternal Age At EDD: 22.7 yr
OSBR Risk 1 IN: 10000
Test Results:: NEGATIVE
Weight: 118 [lb_av]

## 2021-07-02 ENCOUNTER — Encounter: Payer: Self-pay | Admitting: *Deleted

## 2021-07-14 ENCOUNTER — Ambulatory Visit: Payer: BC Managed Care – PPO

## 2021-08-05 ENCOUNTER — Telehealth: Payer: Self-pay | Admitting: Obstetrics and Gynecology

## 2021-08-05 NOTE — Telephone Encounter (Signed)
Left message for patient to call, as I was going to offer genetic counseling through virtual meeting today so that she would have it prior to her ultrasound on 8/15.  Currently the genetic counseling visit is on 8/16.  If she calls, I will try to see her today, otherwise, we will leave the visits as scheduled. (There is no availability on 8/12 or 8/15).  Cherly Anderson, MS, CGC

## 2021-08-09 ENCOUNTER — Ambulatory Visit: Payer: Medicaid Other | Admitting: *Deleted

## 2021-08-09 ENCOUNTER — Ambulatory Visit (HOSPITAL_BASED_OUTPATIENT_CLINIC_OR_DEPARTMENT_OTHER): Payer: Medicaid Other | Admitting: Obstetrics

## 2021-08-09 ENCOUNTER — Ambulatory Visit: Payer: Medicaid Other | Attending: Obstetrics

## 2021-08-09 ENCOUNTER — Other Ambulatory Visit: Payer: Self-pay

## 2021-08-09 VITALS — BP 114/65 | HR 75

## 2021-08-09 DIAGNOSIS — Z3A22 22 weeks gestation of pregnancy: Secondary | ICD-10-CM | POA: Diagnosis present

## 2021-08-09 DIAGNOSIS — O280 Abnormal hematological finding on antenatal screening of mother: Secondary | ICD-10-CM | POA: Insufficient documentation

## 2021-08-09 DIAGNOSIS — Z3491 Encounter for supervision of normal pregnancy, unspecified, first trimester: Secondary | ICD-10-CM | POA: Insufficient documentation

## 2021-08-09 DIAGNOSIS — Z362 Encounter for other antenatal screening follow-up: Secondary | ICD-10-CM | POA: Diagnosis present

## 2021-08-09 NOTE — Progress Notes (Signed)
MFM Note  Gloria Orr was seen for a follow up exam due to an atypical finding noted on her cell free DNA test.  Her cell free DNA test showed atypical findings that was outside the scope of the test.  The origin of the atypical finding involves chromosome 13, could not be specified.  [facility] did not recommend repeat cell free DNA testing.  Due to the atypical finding, no results were reported for trisomy 21, 18, and 13.  The fetal gender could not be deciphered based on this test.    The patient denies any problems since her last exam.   She was informed that the fetal growth and amniotic fluid level appears appropriate for her gestational age.  Although limited today due to the fetal position, there were no obvious fetal anomalies noted today.  The patient is scheduled to meet with our genetic counselor tomorrow.  Due to the atypical finding noted on her cell free DNA test, she was offered and declined an amniocentesis today for definitive diagnosis of fetal aneuploidy.  As the atypical finding noted on her cell free DNA test may be related to fetal and/or maternal mosaicism, fetal and/ or maternal chromosome abnormality, we will continue to follow her with growth ultrasounds throughout her pregnancy.  A follow-up exam was scheduled in 4 weeks.  A total of 15 minutes was spent counseling and coordinating the care for this patient.  Greater than 50% of the time was spent in direct face-to-face contact.  Recommendations:  Repeat ultrasound in 4 weeks

## 2021-08-10 ENCOUNTER — Other Ambulatory Visit: Payer: Self-pay | Admitting: *Deleted

## 2021-08-10 ENCOUNTER — Ambulatory Visit: Payer: Medicaid Other | Attending: Student

## 2021-08-10 ENCOUNTER — Ambulatory Visit: Payer: Self-pay

## 2021-08-10 DIAGNOSIS — O280 Abnormal hematological finding on antenatal screening of mother: Secondary | ICD-10-CM

## 2021-08-10 DIAGNOSIS — O281 Abnormal biochemical finding on antenatal screening of mother: Secondary | ICD-10-CM | POA: Diagnosis not present

## 2021-08-10 DIAGNOSIS — Z3A00 Weeks of gestation of pregnancy not specified: Secondary | ICD-10-CM | POA: Diagnosis not present

## 2021-08-10 DIAGNOSIS — Z315 Encounter for genetic counseling: Secondary | ICD-10-CM

## 2021-08-10 DIAGNOSIS — Z843 Family history of consanguinity: Secondary | ICD-10-CM | POA: Diagnosis not present

## 2021-08-11 NOTE — Progress Notes (Signed)
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  She presented to the visit alone, and was counseled by myself along with Legrand Pitts, genetic counseling intern.  GloriaOrr had [facility] noninvasive prenatal screening (NIPS) through [place] that demonstrated a "suspected atypical finding outside the scope of the test". Per a representative from [place], there is suspicion of an abnormality involving chromosome 13.  It could not be determined if the possible difference is originating from the pregnancy or from the patient herself. We reviewed that NIPS analyzes cell-free DNA from the placenta found in the maternal bloodstream during pregnancy. Based on this, we discussed that there are several possibilities that could warrant her atypical NIPS result, including the fetus having a chromosome abnormality that could not be detected by this screen, confined placental mosaicism (a result representative of the placenta only rather than the fetus) for a chromosomal abnormality, maternal and fetal chromosomal material being too similar to make a result call due to parental shared ancestry, a maternal chromosome difference and normal variation.    Since the laboratory suspects that there is an abnormality of chromosome 13 origin, we discussed possible fetal abnormalities that would not be detectable by NIPS but, could lead to an atypical result. These include deletions or duplications of chromosome 13 material, a chromosomal rearrangement involving chromosome 13 and possibly another chromosome, or trisomy 13 mosaicism (some cells in the body having trisomy 21, with other cells in the body being chromosomally normal). We discussed that it can be difficult to predict which features an individual with one of these conditions  may have during the prenatal period. Some deletions, duplications, and rearrangements are benign, whereas others may be associated with problems with health or development. Individuals with mosaicism for a chromosome abnormality may display widely variable clinical features. It is impossible to predict the percentage of cells with the chromosome difference that are present in some organs, such as the brain or heart, kidneys which makes prediction of features challenging.    We also discussed possible explanations for Gloria Orr's NIPS result that do not relate to the fetus. Since NIPS is assessing placental rather than fetal cells, it is possible that the placenta may have an abnormality involving chromosome 13 while the fetus may be chromosomally normal. This is a phenomenon known as confined placental mosaicism. It is also possible that the patient has a difference in her chromosomes that could contribute to this result. For that reason, we also offered the option of chromosome analysis on Gloria Orr.  Also of note, the patient and her partner are consanguinous.  They are first cousins once removed.  Because relatives share DNA sequences in common, it is possible that the similarity of DNA between the patient and the baby due to consanguinity may make interpretation of NIPS results more complex.  One would expect this to be reflected in all the chromosomal information, not only chromosome 13, but the lab could not comment further on this possibility.   In order to get further information about the fetus's chromosomes, Gloria Orr was counseled that she could pursue diagnostic testing through amniocentesis.  She is currently 22weeks, 6 days pregnant and had previously spoken with our MFM about this option at her last ultrasound visit.  She declines invasive testing.  Next, we reviewed her carrier  screening for Cystic fibrosis and hemoglobinopathies, which are normal as well as the results for spinal  muscular atrophy (SMA). SMA is a recessive genetic condition with variable age of onset and severity caused by mutations in the SMN1 gene.  To review, a recessive condition occurs in a child when both the mother and the father are carriers of the genetic condition and both pass on that altered gene to the child.  The features of SMA are caused by the loss of motor neurons which leads to progressive muscle weakness and muscle wasting (atrophy) in infancy.  There are several types of SMA based upon severity, but in the most common type (type 1), children may pass away as early as 76 years of age.  Carrier testing assesses the number of copies of the SMN1 gene, from zero to four.  Persons with one copy of the SMN1 gene are carriers, and those with no copies are affected with the condition.  Individuals with two or more copies have a reduced chance to be a carrier.  Persons with 3 or 4 copies of this gene are highly unlikely to be carriers.  However, someone with two copies of the gene can either have two copies of this gene on separate chromosomes and thus not be a carrier or they could have two copies of this gene on the same chromosome with no copies on the other chromosome and be a "silent" carrier.  This silent carrier status is known to be more common in persons of African American or Ashkenazi Jewish ancestry, particularly when they are found to be positive for a variant in the gene known as c. *3+80T>G. 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  This testing cannot confirm or eliminate the chance to have a child with SMA. It is estimated that current carrier screening can detect 94.8% of carriers in the Caucasian population and 70.5% of carriers in the African American population.  We offered the option of having her partner tested as well.  If he is found to be a carrier, then  the pregnancy may be at increased risk for SMA and testing would be offered during the pregnancy through amniocentesis or at the time of birth. At this time, without testing, Karie Mainland has a chance of 1 in 75 to be a carrier given his African ancestry and no known family history of SMA.  Overall, the chance for this couple to have a child with SMA at this time is 1/72 X 1/34 X = 1 in 9,792. If he were to have testing that showed a low chance for him to be a carrier, this number could be decreased significantly.  If his testing were to show that he is a carrier, then the number would be increased. The patient declined additional testing on him at this time. We also talked about the possibility of testing at the time of delivery. Beginning in 2021, all newborn babies in [place] are test for SMA as well as numerous other conditions as part of state mandated newborn screening. Testing on cord blood or of the baby sometime after delivery could also be ordered if desired.  We also obtained a detailed family history and pregnancy history.  The patient is G2P0010 at 22weeks, 6 days gestation.  She and her partner, Karie Mainland, had a first trimester miscarriage  at age 64. In the current pregnancy, she reported no complications or exposures to medications, alcohol, tobacco or recreational drugs.  She was a little concerned about being around second hand smoke on a recent trip, but was reassured that limited exposure at a cafe in the second trimester was unlikely to be harmful. They both identify as African and have a consanguineous family; they are first cousins once removed (his father and her paternal grandmother are siblings). We discussed the implications of consanguinity in the family including increased risk for recessive conditions. No specific recessive conditions were reported in the family. We offered expanded carrier screening which the patient declined. Ms Chokshi's sister was born with a heart defect suspected in the valves as  well as differences in tear ducts which gave her eyes a slanted appeared. We discussed that heart defects are the among the most common birth defects and can be isolated or syndromic. These can occur in about 1/200 pregnancies. In the absence of a known genetic syndrome, most cases are thought to be multifactorial, or due to a combination of genetic and environmental factors. Because her sister is a 2nd degree relative to the pregnancy we would expect a less than 1% chance for this pregnancy to have a heart defect. We offered a fetal echocardiogram which the patient declined. Another of her 11 siblings (7 full, 4 half through her father) passed away days after birth in 2002. The patient didn't have more details about this. Similarly, one of Ali's passed at 39 months of age with an unknown cause. We explained that more information would be needed to determine the level of concern based upon this history, as there can be many reasons for infant death. A maternal aunt is reported to have polycystic ovarian syndrome and had 3 miscarriages, two before 12 weeks and one around 4 months. Because there are many causes for recurrent losses which may or may not be genetic, we would need more information to make an assessment about this finding.  The remainder of the family history was unremarkable for birth defects, intellectual disabilities, muscle weakness conditions, early death or known genetic conditions.  Plan of care: Follow up ultrasound with MFM as scheduled for growth. Declined amniocentesis for atypical NIPS result.  Patient was reassured by normal fetal anatomy ultrasound. Declined SMA carrier screening for [facility].  Prefers to await newborn screening results. Declined karyotype on herself, offered due to possible maternal contribution to atypical NIPS.  Cherly Anderson, MS, CGC   Thank you for allowing [place] to be involved in the care of this patient.  We encouraged her to call with any questions or concerns  as they arise.  We may be reached at [phone].  Plan of care:  Patient and FOB to determine if they desire his testing. If so, she will call me to place orders and make arrangements at a [facility] site in [place]. If they decide not to have FOB tested due to cost, then patient plans to follow up with newborn screening results at the time of delivery.   Plan of Care:   We may be reached at [phone] with any questions or concerns.

## 2021-08-18 ENCOUNTER — Other Ambulatory Visit: Payer: Self-pay

## 2021-08-18 ENCOUNTER — Ambulatory Visit (INDEPENDENT_AMBULATORY_CARE_PROVIDER_SITE_OTHER): Payer: Medicaid Other

## 2021-08-18 VITALS — BP 110/70 | HR 80 | Wt 126.1 lb

## 2021-08-18 DIAGNOSIS — Z3A24 24 weeks gestation of pregnancy: Secondary | ICD-10-CM

## 2021-08-18 DIAGNOSIS — Z3490 Encounter for supervision of normal pregnancy, unspecified, unspecified trimester: Secondary | ICD-10-CM

## 2021-08-18 NOTE — Progress Notes (Signed)
   LOW-RISK PREGNANCY OFFICE VISIT  Patient name: Gloria Orr MRN [id number]  Date of birth: 06/06/2001 Chief Complaint:   Routine Prenatal Visit  Subjective:   Gloria Orr is a 20 y.o. 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  Patient presents today with no complaints.  Patient endorses fetal movement. Patient denies abdominal cramping or contractions.  Patient denies vaginal concerns including abnormal discharge, leaking of fluid, and bleeding.  Contractions: Not present. Vag. Bleeding: None.  Movement: Present.  Patient requests note stating that she is pregnant.  She states she needs this so her mother can get a visa to assist her after delivery.   Reviewed past medical,surgical, social, obstetrical and family history as well as problem list, medications and allergies.  Objective   Vitals:   08/18/21 1425  BP: 110/70  Pulse: 80  Weight: 126 lb 1.6 oz (57.2 kg)  Body mass index is 23.06 kg/m.  Total Weight Gain:6 lb 1.6 oz (2.767 kg)         Physical Examination:   General appearance: Well appearing, and in no distress  Mental status: Alert, oriented to person, place, and time  Skin: Warm & dry  Cardiovascular: Normal heart rate noted  Respiratory: Normal respiratory effort, no distress  Abdomen: Soft, gravid, nontender, AGA with Fundal Height: 26 cm  Pelvic: Cervical exam deferred           Extremities: Edema: None  Fetal Status: Fetal Heart Rate (bpm): 141  Movement: Present   No results found for this or any previous visit (from the past 24 hour(s)).  Assessment & Plan:  Low-risk pregnancy of a 20 y.o., G2P0010 at [redacted]w[redacted]d with an Estimated Date of Delivery: 12/08/21   1. Encounter for supervision of  low-risk pregnancy, antepartum -Anticipatory guidance for upcoming appts. -Patient to schedule next appt in 3-4 weeks for an in-person visit. -Reviewed Glucola appt preparation including fasting the night before and morning of.   *Informed that it is okay to drink plain water throughout the night and prior to consumption of Glucola formula.  -Discussed anticipated office time of 2.5-3 hours.  -Reviewed blood draw procedures and labs which also include check of iron/HgB level, RPR, and HIV *Informed that repeat RPR/HIV are for pediatric records/compliance.  -Discussed how results of GTT are handled including diabetic education and BS testing for abnormal results and routine care for normal results.   2. [redacted] weeks gestation of pregnancy -Doing well. -Informed that note will be given to allow mother visa for support after delivery.     Meds: No orders of the defined types were placed in this encounter.  Labs/procedures today:  Lab Orders  No laboratory test(s) ordered today     Reviewed: Preterm labor symptoms and general obstetric precautions including but not limited to vaginal bleeding, contractions, leaking of fluid and fetal movement were reviewed in detail with the patient.  All questions were answered.  Follow-up: Return in about 4 weeks (around 09/15/2021) for LROB with GTT.  No orders of the defined types were placed in this encounter.  Cherre Robins MSN, CNM 08/18/2021

## 2021-08-18 NOTE — Progress Notes (Signed)
Pt states is needing letter

## 2021-09-06 ENCOUNTER — Other Ambulatory Visit: Payer: Self-pay | Admitting: *Deleted

## 2021-09-06 ENCOUNTER — Encounter: Payer: Self-pay | Admitting: *Deleted

## 2021-09-06 ENCOUNTER — Other Ambulatory Visit: Payer: Self-pay

## 2021-09-06 ENCOUNTER — Ambulatory Visit: Payer: Medicaid Other | Admitting: *Deleted

## 2021-09-06 ENCOUNTER — Encounter: Payer: Self-pay | Admitting: Family Medicine

## 2021-09-06 ENCOUNTER — Ambulatory Visit: Payer: Medicaid Other | Attending: Obstetrics

## 2021-09-06 VITALS — BP 115/63 | HR 83

## 2021-09-06 DIAGNOSIS — O280 Abnormal hematological finding on antenatal screening of mother: Secondary | ICD-10-CM | POA: Diagnosis present

## 2021-09-06 DIAGNOSIS — Z3689 Encounter for other specified antenatal screening: Secondary | ICD-10-CM

## 2021-09-06 DIAGNOSIS — O43192 Other malformation of placenta, second trimester: Secondary | ICD-10-CM

## 2021-09-06 DIAGNOSIS — Z3491 Encounter for supervision of normal pregnancy, unspecified, first trimester: Secondary | ICD-10-CM | POA: Insufficient documentation

## 2021-09-06 DIAGNOSIS — O43199 Other malformation of placenta, unspecified trimester: Secondary | ICD-10-CM

## 2021-09-06 DIAGNOSIS — Z3A26 26 weeks gestation of pregnancy: Secondary | ICD-10-CM | POA: Diagnosis not present

## 2021-09-15 ENCOUNTER — Other Ambulatory Visit: Payer: Medicaid Other

## 2021-09-15 ENCOUNTER — Other Ambulatory Visit: Payer: Self-pay

## 2021-09-15 ENCOUNTER — Ambulatory Visit (INDEPENDENT_AMBULATORY_CARE_PROVIDER_SITE_OTHER): Payer: Medicaid Other | Admitting: Family Medicine

## 2021-09-15 VITALS — BP 109/70 | HR 85 | Wt 131.7 lb

## 2021-09-15 DIAGNOSIS — Z3491 Encounter for supervision of normal pregnancy, unspecified, first trimester: Secondary | ICD-10-CM

## 2021-09-15 DIAGNOSIS — Z3A28 28 weeks gestation of pregnancy: Secondary | ICD-10-CM

## 2021-09-15 DIAGNOSIS — O3510X0 Maternal care for (suspected) chromosomal abnormality in fetus, unspecified, not applicable or unspecified: Secondary | ICD-10-CM

## 2021-09-15 DIAGNOSIS — Z23 Encounter for immunization: Secondary | ICD-10-CM

## 2021-09-15 DIAGNOSIS — O351XX0 Maternal care for (suspected) chromosomal abnormality in fetus, not applicable or unspecified: Secondary | ICD-10-CM

## 2021-09-15 DIAGNOSIS — N949 Unspecified condition associated with female genital organs and menstrual cycle: Secondary | ICD-10-CM

## 2021-09-15 DIAGNOSIS — Z3490 Encounter for supervision of normal pregnancy, unspecified, unspecified trimester: Secondary | ICD-10-CM

## 2021-09-15 MED ORDER — PRENATAL VITAMINS 28-0.8 MG PO TABS: 1.0000 | ORAL_TABLET | Freq: Every day | ORAL | 3 refills | Status: AC

## 2021-09-15 NOTE — Progress Notes (Signed)
Patient complains pain in ligaments when she walks or sit for long periods of time. She also mentioned pressure on vaginal pressure along with lower back pain   Mark Hassey, CMA  09/15/21

## 2021-09-15 NOTE — Progress Notes (Signed)
   Subjective:  Gloria Orr is a 20 y.o. G2P0010 at [redacted]w[redacted]d being seen today for ongoing prenatal care.  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  Patient reports backache and some right sided pain of the crease in her groin .  Contractions: Not present. Vag. Bleeding: None.  Movement: Present. Denies leaking of fluid.   The following portions of the patient's history were reviewed and updated as appropriate: allergies, current medications, past family history, past medical history, past social history, past surgical history and problem list. Problem list updated.  Objective:   Vitals:   09/15/21 0906  BP: 109/70  Pulse: 85  Weight: 131 lb 11.2 oz (59.7 kg)    Fetal Status: Fetal Heart Rate (bpm): 152   Movement: Present     General:  Alert, oriented and cooperative. Patient is in no acute distress.  Skin: Skin is warm and dry. No rash noted.   Cardiovascular: Normal heart rate noted  Respiratory: Normal respiratory effort, no problems with respiration noted  Abdomen: Soft, gravid, appropriate for gestational age. Pain/Pressure: Present     Pelvic: Vag. Bleeding: None     Cervical exam deferred        Extremities: Normal range of motion.  Edema: None  Mental Status: Normal mood and affect. Normal behavior. Normal judgment and thought content.     Assessment and Plan:  Pregnancy: G2P0010 at [redacted]w[redacted]d  1. Encounter for supervision of low-risk pregnancy, antepartum 2. [redacted] weeks gestation of pregnancy 3. Supervision of low-risk pregnancy, first trimester Overall doing well. Has some low back pain. Recommended pregnancy band. No other obstetric concerns at this time. 28 week labs today. Follow up in 4 weeks - refills for PNV sent  4. Chromosomal abnormality in fetus  affecting management of mother, single or unspecified fetus Seen by genetic counseling. Declined amniocentesis  5. Marginal cord insertion on [place] Seen on 9/12 [place]. Has follow up US scheduled on 10/17  6. Right groin crease pain No TTP. Worse with walking or movement. Has not tried anything for it yet. Feels like its stretching. Denies vaginal discharge and bleeding. Denies urinary symptoms. Likely round ligament pain - discussed using  Tylenol as needed and heat packs   Preterm labor symptoms and general obstetric precautions including but not limited to vaginal bleeding, contractions, leaking of fluid and fetal movement were reviewed in detail with the patient. Please refer to After Visit Summary for other counseling recommendations.  Return in about 4 weeks (around 10/13/2021) for LROB.   Warner Mccreedy, MD, MPH OB Fellow, Faculty [facility]

## 2021-09-16 LAB — CBC
Hematocrit: 35.9 % (ref 34.0–46.6)
Hemoglobin: 12.3 g/dL (ref 11.1–15.9)
MCH: 30.3 pg (ref 26.6–33.0)
MCHC: 34.3 g/dL (ref 31.5–35.7)
MCV: 88 fL (ref 79–97)
Platelets: 205 10*3/uL (ref 150–450)
RBC: 4.06 x10E6/uL (ref 3.77–5.28)
RDW: 12.2 % (ref 11.7–15.4)
WBC: 8.6 10*3/uL (ref 3.4–10.8)

## 2021-09-16 LAB — GLUCOSE TOLERANCE, 2 HOURS W/ 1HR
Glucose, 1 hour: 163 mg/dL (ref 65–179)
Glucose, 2 hour: 138 mg/dL (ref 65–152)
Glucose, Fasting: 72 mg/dL (ref 65–91)

## 2021-09-16 LAB — HIV ANTIBODY (ROUTINE TESTING W REFLEX): HIV Screen 4th Generation wRfx: NONREACTIVE

## 2021-09-16 LAB — RPR: RPR Ser Ql: NONREACTIVE

## 2021-09-17 ENCOUNTER — Other Ambulatory Visit: Payer: Self-pay | Admitting: Family Medicine

## 2021-09-17 DIAGNOSIS — Z3491 Encounter for supervision of normal pregnancy, unspecified, first trimester: Secondary | ICD-10-CM

## 2021-10-11 ENCOUNTER — Ambulatory Visit: Payer: Medicaid Other | Admitting: *Deleted

## 2021-10-11 ENCOUNTER — Other Ambulatory Visit: Payer: Self-pay | Admitting: *Deleted

## 2021-10-11 ENCOUNTER — Ambulatory Visit: Payer: Medicaid Other | Attending: Maternal & Fetal Medicine

## 2021-10-11 ENCOUNTER — Other Ambulatory Visit: Payer: Self-pay

## 2021-10-11 VITALS — BP 110/60 | HR 87

## 2021-10-11 DIAGNOSIS — Z3689 Encounter for other specified antenatal screening: Secondary | ICD-10-CM | POA: Diagnosis present

## 2021-10-11 DIAGNOSIS — Z3491 Encounter for supervision of normal pregnancy, unspecified, first trimester: Secondary | ICD-10-CM | POA: Diagnosis present

## 2021-10-11 DIAGNOSIS — O43193 Other malformation of placenta, third trimester: Secondary | ICD-10-CM | POA: Diagnosis not present

## 2021-10-11 DIAGNOSIS — O280 Abnormal hematological finding on antenatal screening of mother: Secondary | ICD-10-CM | POA: Insufficient documentation

## 2021-10-11 DIAGNOSIS — O43199 Other malformation of placenta, unspecified trimester: Secondary | ICD-10-CM | POA: Insufficient documentation

## 2021-10-11 DIAGNOSIS — Z3A31 31 weeks gestation of pregnancy: Secondary | ICD-10-CM

## 2021-10-13 ENCOUNTER — Encounter: Payer: Self-pay | Admitting: Family Medicine

## 2021-10-13 ENCOUNTER — Other Ambulatory Visit: Payer: Self-pay

## 2021-10-13 ENCOUNTER — Ambulatory Visit (INDEPENDENT_AMBULATORY_CARE_PROVIDER_SITE_OTHER): Payer: Medicaid Other | Admitting: Family Medicine

## 2021-10-13 VITALS — BP 114/66 | HR 97 | Wt 136.2 lb

## 2021-10-13 DIAGNOSIS — Z3491 Encounter for supervision of normal pregnancy, unspecified, first trimester: Secondary | ICD-10-CM

## 2021-10-13 DIAGNOSIS — K009 Disorder of tooth development, unspecified: Secondary | ICD-10-CM

## 2021-10-13 NOTE — Progress Notes (Signed)
   PRENATAL VISIT NOTE  Subjective:  Gloria Orr is a 20 y.o. G2P0010 at [redacted]w[redacted]d being seen today for ongoing prenatal care.  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  Patient reports no complaints.  Contractions: Not present. Vag. Bleeding: None.  Movement: Present. Denies leaking of fluid.   The following portions of the patient's history were reviewed and updated as appropriate: allergies, current medications, past family history, past medical history, past social history, past surgical history and problem list.   Objective:   Vitals:   10/13/21 1125  BP: 114/66  Pulse: 97  Weight: 136 lb 3.2 oz (61.8 kg)    Fetal Status: Fetal Heart Rate (bpm): 142 Fundal Height: 30 cm Movement: Present     General:  Alert, oriented and cooperative. Patient is in no acute distress.  Skin: Skin is warm and dry. No rash noted.   Cardiovascular: Normal heart rate noted  Respiratory: Normal respiratory effort, no problems with respiration noted  Abdomen: Soft, gravid, appropriate for gestational age.  Pain/Pressure: Present     Pelvic: Cervical exam deferred        Extremities: Normal range of motion.  Edema: None  Mental Status: Normal mood and affect. Normal behavior. Normal judgment and thought content.   Assessment and Plan:  Pregnancy: G2P0010 at [redacted]w[redacted]d 1. Supervision of low-risk pregnancy, first trimester Continue routine prenatal care.   2. Dental anomaly Needs filling replaced - Ambulatory referral to Dentistry  Preterm labor symptoms and general obstetric precautions including but not limited to vaginal bleeding, contractions, leaking of fluid and fetal movement were reviewed in detail with the patient. Please refer to After Visit Summary for other counseling  recommendations.   Return in 2 weeks (on 10/27/2021) for [facility] - needs dental referral please.  Future Appointments  Date Time Provider Department Center  10/27/2021  3:35 PM Venora Maples, MD [facility]  11/09/2021  2:30 PM [facility] NURSE [facility]  11/09/2021  2:45 PM [facility] US5 [facility]    Reva Bores, MD

## 2021-10-23 IMAGING — US US OB TRANSVAGINAL
1 series · 15 of 28 positions shown · non-contrast
Comparison: 03/24/2020

CLINICAL DATA: Bleeding and pelvic pain

EXAM:
TRANSVAGINAL OB ULTRASOUND
TECHNIQUE: Transvaginal ultrasound was performed for complete evaluation of the
gestation as well as the maternal uterus, adnexal regions, and
pelvic cul-de-sac.

[Series 1: us ob transvaginal · 40 acquisitions, 15 frames shown]
[im 1/40]
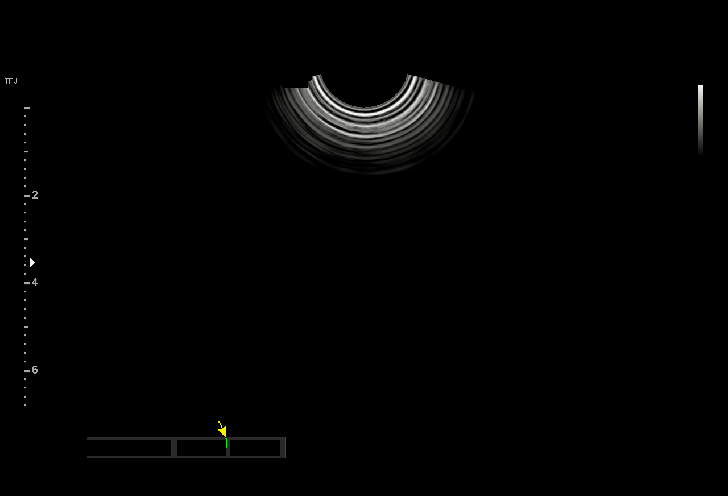
[im 3/40]
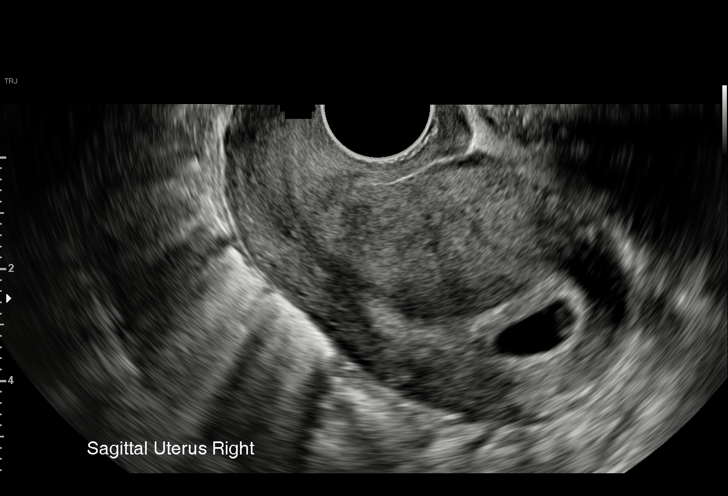
[im 6/40]
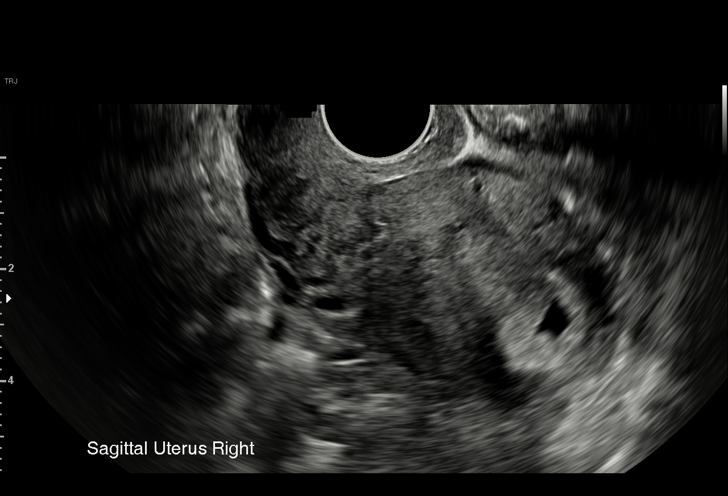
[im 9/40]
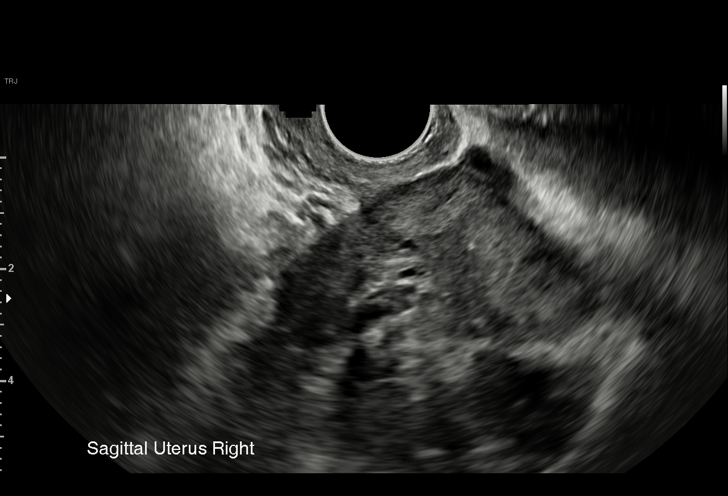
[im 12/40]
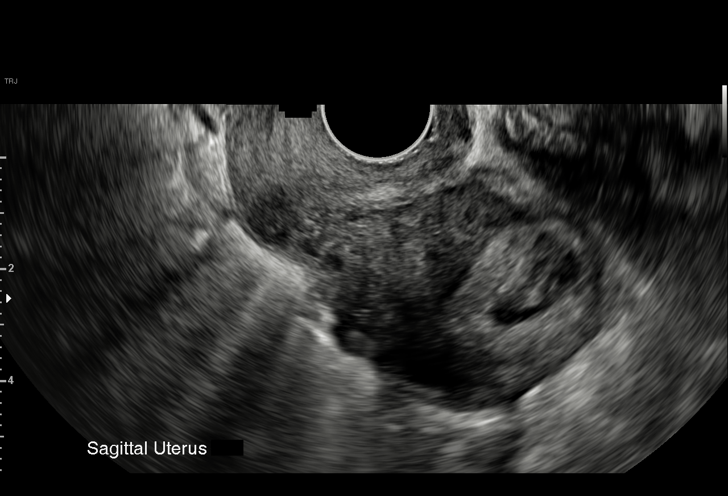
[im 15/40]
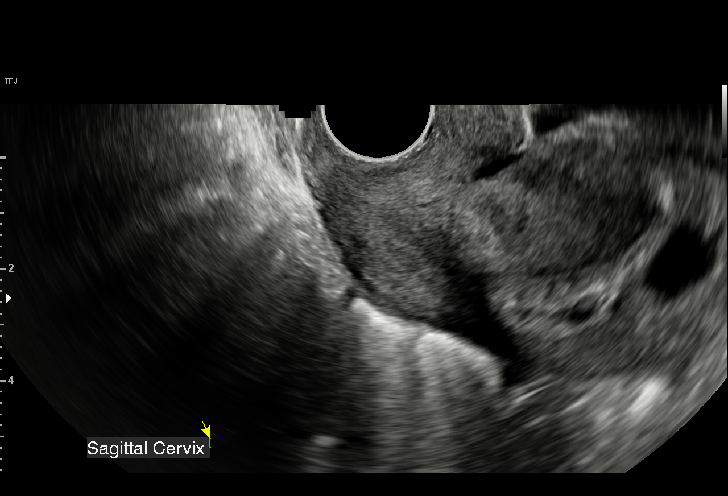
[im 18/40]
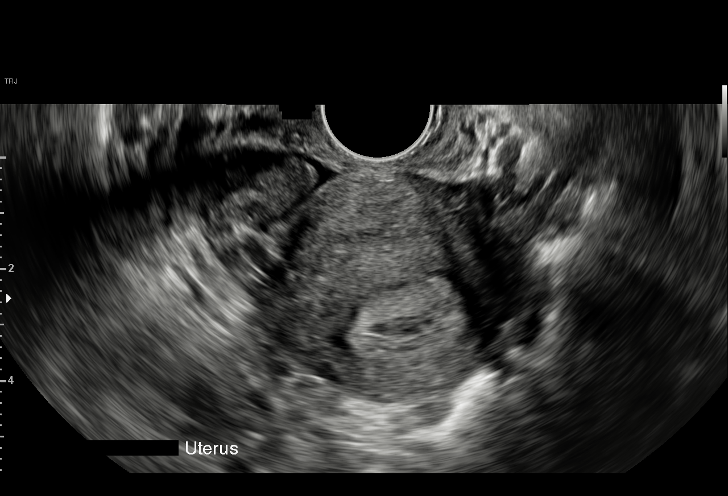
[im 21/40]
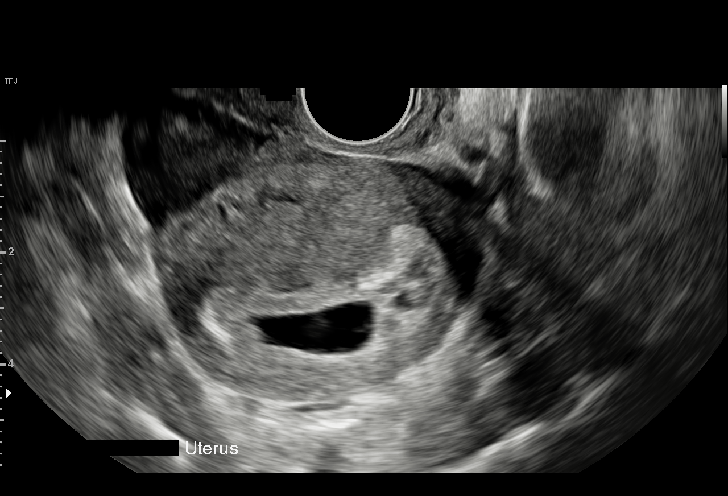
[im 22/40]
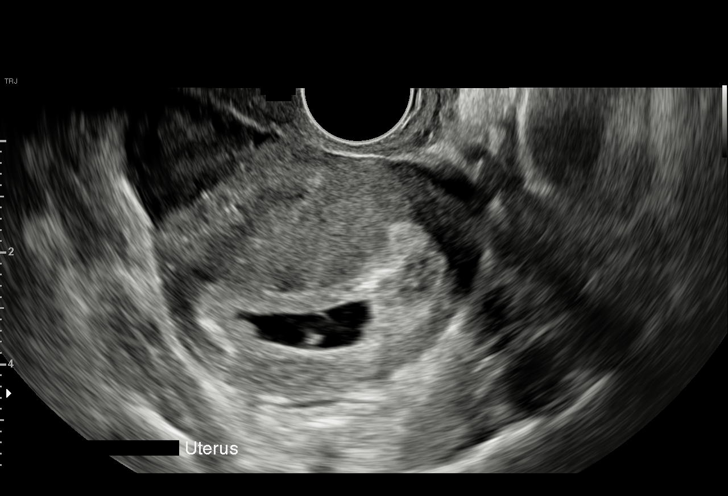
[im 25/40]
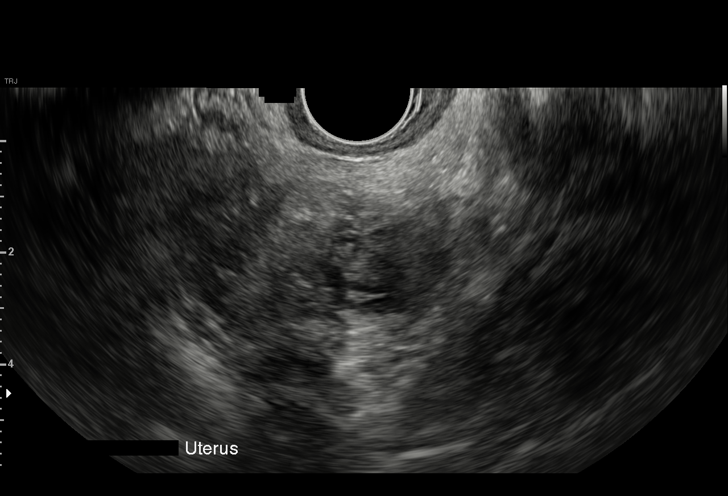
[im 28/40]
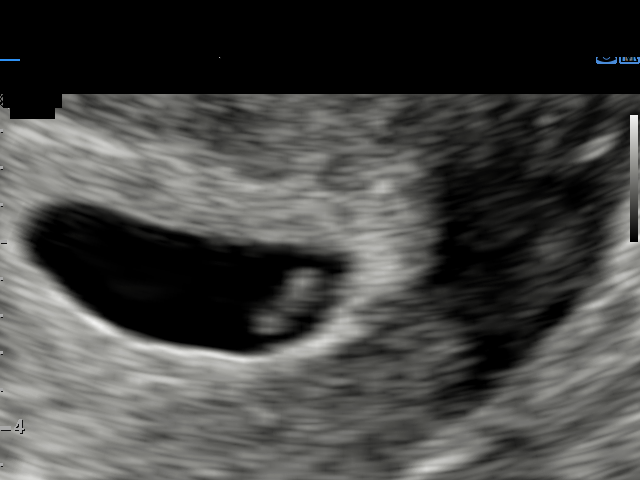
[im 31/40]
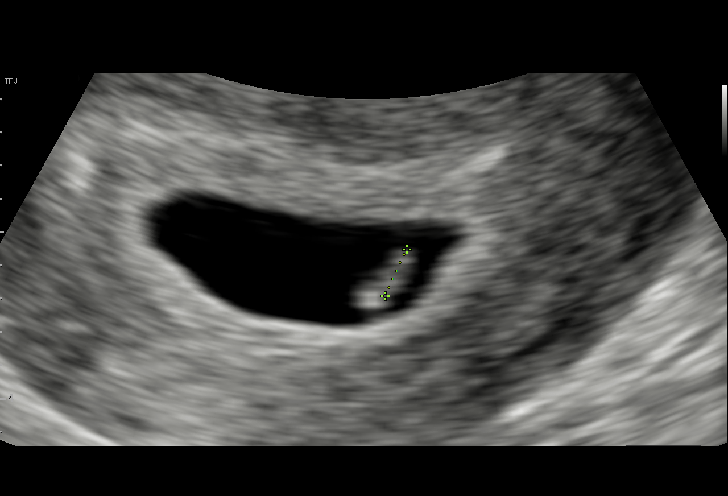
[im 34/40]
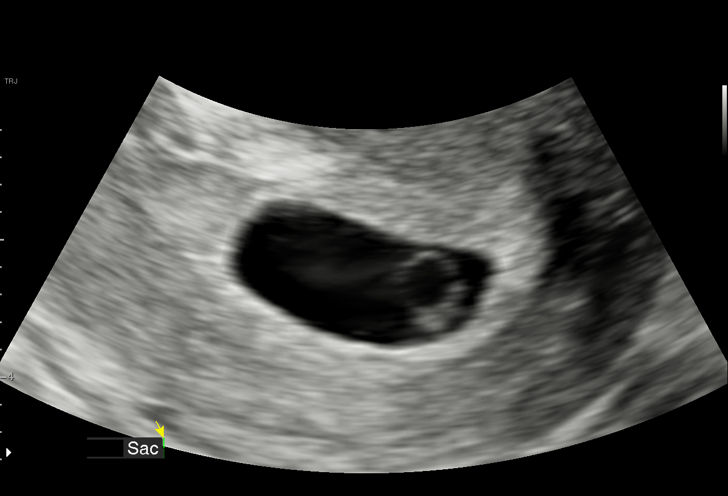
[im 37/40]
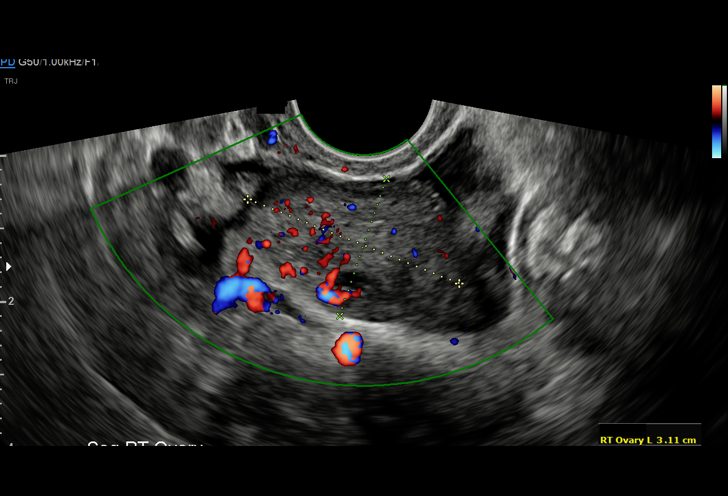
[im 40/40]
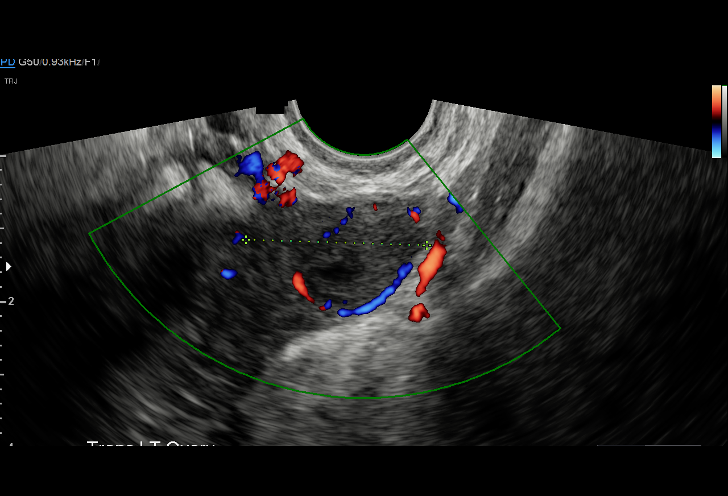

[15 of 28 positions shown; findings below may reference images not displayed]

FINDINGS: Intrauterine gestational sac: Present

Yolk sac:  Present

Embryo:  Present

Cardiac Activity: Present

Heart Rate: 85 bpm

CRL:   3 mm   5 w 6 d                  US EDC: 11/24/2020

Subchorionic hemorrhage: Small 2.3 x 1.0 cm left lateral
subchorionic hemorrhage is noted.

Maternal uterus/adnexae: Corpus luteum cyst is noted within the left
ovary.
IMPRESSION: Single live intrauterine gestation at 5 weeks 6 days with positive
cardiac activity.

Small subchorionic hemorrhage.

## 2021-10-27 ENCOUNTER — Ambulatory Visit (INDEPENDENT_AMBULATORY_CARE_PROVIDER_SITE_OTHER): Payer: Medicaid Other | Admitting: Family Medicine

## 2021-10-27 ENCOUNTER — Other Ambulatory Visit: Payer: Self-pay

## 2021-10-27 ENCOUNTER — Encounter: Payer: Self-pay | Admitting: Family Medicine

## 2021-10-27 VITALS — BP 119/83 | HR 98 | Wt 137.8 lb

## 2021-10-27 DIAGNOSIS — Z3491 Encounter for supervision of normal pregnancy, unspecified, first trimester: Secondary | ICD-10-CM

## 2021-10-27 DIAGNOSIS — O43199 Other malformation of placenta, unspecified trimester: Secondary | ICD-10-CM | POA: Insufficient documentation

## 2021-10-27 DIAGNOSIS — O3510X0 Maternal care for (suspected) chromosomal abnormality in fetus, unspecified, not applicable or unspecified: Secondary | ICD-10-CM

## 2021-10-27 NOTE — Progress Notes (Signed)
   Subjective:  Gloria Orr is a 20 y.o. G2P0010 at [redacted]w[redacted]d being seen today for ongoing prenatal care.  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  Patient reports no complaints.  Contractions: Not present. Vag. Bleeding: None.  Movement: Present. Denies leaking of fluid.   The following portions of the patient's history were reviewed and updated as appropriate: allergies, current medications, past family history, past medical history, past social history, past surgical history and problem list. Problem list updated.  Objective:   Vitals:   10/27/21 1559  BP: 119/83  Pulse: 98  Weight: 137 lb 12.8 oz (62.5 kg)    Fetal Status: Fetal Heart Rate (bpm): 154   Movement: Present     General:  Alert, oriented and cooperative. Patient is in no acute distress.  Skin: Skin is warm and dry. No rash noted.   Cardiovascular: Normal heart rate noted  Respiratory: Normal respiratory effort, no problems with respiration noted  Abdomen: Soft, gravid, appropriate for gestational age. Pain/Pressure: Present     Pelvic: Vag. Bleeding: None     SVE: **         Extremities: Normal range of motion.  Edema: None  Mental Status: Normal mood and affect. Normal behavior. Normal judgment and thought content.   Urinalysis:      Assessment and Plan:  Pregnancy: G2P0010 at [redacted]w[redacted]d  1. Supervision of low-risk pregnancy, first trimester BP and FHR normal Reported leaking fluid, sterile speculum exam with neg pool, neg valsalva, neg fern slide Extremely friable cervix that began bleeding with speculum exam, advised patient that spotting would be normal for next day or so Reassured patient no evidence of rupture, no unusual discharge  seen on exam  2. Chromosomal abnormality in fetus affecting management of mother, single or unspecified fetus Declined amnio  3. Marginal insertion of umbilical cord affecting management of mother Following w MFM, last growth 19%  Preterm labor symptoms and general obstetric precautions including but not limited to vaginal bleeding, contractions, leaking of fluid and fetal movement were reviewed in detail with the patient. Please refer to After Visit Summary for other counseling recommendations.  Return in 2 weeks (on 11/10/2021) for [facility], ob visit.   Venora Maples, MD

## 2021-10-27 NOTE — Patient Instructions (Signed)

## 2021-11-09 ENCOUNTER — Ambulatory Visit: Payer: Medicaid Other | Admitting: *Deleted

## 2021-11-09 ENCOUNTER — Other Ambulatory Visit: Payer: Self-pay

## 2021-11-09 ENCOUNTER — Encounter: Payer: Self-pay | Admitting: *Deleted

## 2021-11-09 ENCOUNTER — Ambulatory Visit: Payer: Medicaid Other | Attending: Obstetrics

## 2021-11-09 VITALS — BP 115/68 | HR 89

## 2021-11-09 DIAGNOSIS — Z3491 Encounter for supervision of normal pregnancy, unspecified, first trimester: Secondary | ICD-10-CM | POA: Insufficient documentation

## 2021-11-09 DIAGNOSIS — Z3A35 35 weeks gestation of pregnancy: Secondary | ICD-10-CM | POA: Diagnosis not present

## 2021-11-09 DIAGNOSIS — Z362 Encounter for other antenatal screening follow-up: Secondary | ICD-10-CM | POA: Diagnosis not present

## 2021-11-09 DIAGNOSIS — O289 Unspecified abnormal findings on antenatal screening of mother: Secondary | ICD-10-CM | POA: Diagnosis not present

## 2021-11-09 DIAGNOSIS — O43193 Other malformation of placenta, third trimester: Secondary | ICD-10-CM | POA: Insufficient documentation

## 2021-11-09 DIAGNOSIS — Z148 Genetic carrier of other disease: Secondary | ICD-10-CM | POA: Diagnosis not present

## 2021-11-09 NOTE — Progress Notes (Signed)
   PRENATAL VISIT NOTE  Subjective:  Gloria Orr is a 20 y.o. G2P0010 at 32w0dbeing seen today for ongoing prenatal care.  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  Patient reports  some moles on her breasts and some low back pain but she has this chronically .  Contractions: Not present. Vag. Bleeding: None.  Movement: Present. Denies leaking of fluid.   The following portions of the patient's history were reviewed and updated as appropriate: allergies, current medications, past family history, past medical history, past social history, past surgical history and problem list.   Objective:   Vitals:   11/10/21 1031  BP: 120/78  Pulse: 96  Weight: 139 lb (63 kg)    Fetal Status: Fetal Heart Rate (bpm): 132   Movement: Present     General:  Alert, oriented and cooperative. Patient is in no acute distress.  Skin: Skin is warm and dry. No rash noted.   Cardiovascular: Normal heart rate noted  Respiratory: Normal respiratory effort, no problems with respiration noted  Abdomen: Soft, gravid, appropriate for gestational age.  Pain/Pressure: Present     Pelvic: Pelvic swabs performed in the presence of a chaperone - she declined in presence        Extremities: Normal range of motion.  Edema: None  Mental Status: Normal mood and affect. Normal behavior. Normal judgment and thought content.  Breast: done in presence of chaperone - small moles on the breast - no findings c/w cancerous or precancerous. Likely due to pigment changes in pregnancy. Reviewed concerning s/sx (ABCDs)  Assessment and Plan:  Pregnancy: G2P0010 at 326w0d. 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   2. Supervision of low-risk pregnancy, first trimester - GBS/GC/CT done today - Recommended flu shot - she had it through [employer] employee.  - She hopes to not have an epidural  3. Marginal insertion of umbilical cord affecting management of mother - Growth on 11/15 was 16%ile, normal AC 1762%BWLAFI 11, cephalic  Preterm labor symptoms and general obstetric precautions including but not limited to vaginal bleeding, contractions, leaking of fluid and fetal movement were reviewed in detail with the patient. Please refer to After Visit Summary for other counseling recommendations.   Return in about 1 week (around 11/17/2021) for OB VISIT, MD or APP; may be virtual.  No future appointments.   PaRadene GunningMD

## 2021-11-10 ENCOUNTER — Ambulatory Visit (INDEPENDENT_AMBULATORY_CARE_PROVIDER_SITE_OTHER): Payer: Medicaid Other | Admitting: Obstetrics and Gynecology

## 2021-11-10 ENCOUNTER — Other Ambulatory Visit (HOSPITAL_COMMUNITY)
Admission: RE | Admit: 2021-11-10 | Discharge: 2021-11-10 | Disposition: A | Discharge status: Home / Self Care | Payer: Medicaid Other | Source: Ambulatory Visit | Attending: Obstetrics and Gynecology | Admitting: Obstetrics and Gynecology

## 2021-11-10 VITALS — BP 120/78 | HR 96 | Wt 139.0 lb

## 2021-11-10 DIAGNOSIS — Z3491 Encounter for supervision of normal pregnancy, unspecified, first trimester: Secondary | ICD-10-CM | POA: Insufficient documentation

## 2021-11-10 DIAGNOSIS — O3510X0 Maternal care for (suspected) chromosomal abnormality in fetus, unspecified, not applicable or unspecified: Secondary | ICD-10-CM

## 2021-11-10 DIAGNOSIS — O43199 Other malformation of placenta, unspecified trimester: Secondary | ICD-10-CM

## 2021-11-10 LAB — OB RESULTS CONSOLE GC/CHLAMYDIA: Gonorrhea: NEGATIVE

## 2021-11-10 NOTE — Progress Notes (Signed)
Patient reports lower back pain but otherwise feels fine

## 2021-11-11 LAB — GC/CHLAMYDIA PROBE AMP (~~LOC~~) NOT AT ARMC
Chlamydia: NEGATIVE
Comment: NEGATIVE
Comment: NORMAL
Neisseria Gonorrhea: NEGATIVE

## 2021-11-13 LAB — CULTURE, BETA STREP (GROUP B ONLY): Strep Gp B Culture: NEGATIVE

## 2021-11-22 ENCOUNTER — Ambulatory Visit (INDEPENDENT_AMBULATORY_CARE_PROVIDER_SITE_OTHER): Payer: Medicaid Other | Admitting: Nurse Practitioner

## 2021-11-22 ENCOUNTER — Other Ambulatory Visit: Payer: Self-pay

## 2021-11-22 VITALS — BP 115/78 | HR 97 | Wt 145.3 lb

## 2021-11-22 DIAGNOSIS — Z3491 Encounter for supervision of normal pregnancy, unspecified, first trimester: Secondary | ICD-10-CM

## 2021-11-22 DIAGNOSIS — Z3A37 37 weeks gestation of pregnancy: Secondary | ICD-10-CM

## 2021-11-22 DIAGNOSIS — O3510X0 Maternal care for (suspected) chromosomal abnormality in fetus, unspecified, not applicable or unspecified: Secondary | ICD-10-CM

## 2021-11-22 DIAGNOSIS — O43199 Other malformation of placenta, unspecified trimester: Secondary | ICD-10-CM

## 2021-11-22 NOTE — Progress Notes (Signed)
    Subjective:  Gloria Orr is a 20 y.o. G2P0010 at [redacted]w[redacted]d being seen today for ongoing prenatal care.  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  Patient reports occasional contractions.  Contractions: Irritability. Vag. Bleeding: None.  Movement: Present. Denies leaking of fluid.   The following portions of the patient's history were reviewed and updated as appropriate: allergies, current medications, past family history, past medical history, past social history, past surgical history and problem list. Problem list updated.  Objective:   Vitals:   11/22/21 1441  BP: 115/78  Pulse: 97  Weight: 145 lb 4.8 oz (65.9 kg)    Fetal Status: Fetal Heart Rate (bpm): 130 Fundal Height: 34 cm Movement: Present     General:  Alert, oriented and cooperative. Patient is in no acute distress.  Skin: Skin is warm and dry. No rash noted.   Cardiovascular: Normal heart rate noted  Respiratory: Normal respiratory effort, no problems with respiration noted  Abdomen: Soft, gravid, appropriate for gestational age. Pain/Pressure: Present     Pelvic:  Cervical exam deferred        Extremities: Normal range of motion.  Edema: None  Mental Status: Normal mood and affect. Normal behavior. Normal judgment and thought content.   Urinalysis:      Assessment and Plan:  Pregnancy: G2P0010 at [redacted]w[redacted]d  1. Supervision of low-risk pregnancy, first trimester Encouraged to select pediatrician Has exams from school on Thursday. Having some contractions but no labor yet. Has measured lower than dates in pregnancy Reports lots of pelvic pressure with contractions  2. Marginal insertion of umbilical cord affecting management of  mother Has been followed by MFM - last [place] on 11-09-21 showed baby in 16th percentile.   If concerns about fetal growth, MFM recommending delivery at 39 weeks  3. 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 [redacted] weeks gestation of pregnancy   Term labor symptoms and general obstetric precautions including but not limited to vaginal bleeding, contractions, leaking of fluid and fetal movement were reviewed in detail with the patient. Please refer to After Visit Summary for other counseling recommendations.  Return in about 1 week (around 11/29/2021) for in person ROB.  Nolene Bernheim, RN, MSN, NP-BC Nurse Practitioner, [facility] for [employer], [facility] 11/22/2021 3:05 PM

## 2021-11-30 ENCOUNTER — Other Ambulatory Visit: Payer: Self-pay

## 2021-11-30 ENCOUNTER — Encounter: Payer: Self-pay | Admitting: Advanced Practice Midwife

## 2021-11-30 ENCOUNTER — Ambulatory Visit (INDEPENDENT_AMBULATORY_CARE_PROVIDER_SITE_OTHER): Payer: Medicaid Other | Admitting: Advanced Practice Midwife

## 2021-11-30 VITALS — BP 126/79 | HR 97 | Wt 145.3 lb

## 2021-11-30 DIAGNOSIS — Z3A38 38 weeks gestation of pregnancy: Secondary | ICD-10-CM

## 2021-11-30 DIAGNOSIS — Z3480 Encounter for supervision of other normal pregnancy, unspecified trimester: Secondary | ICD-10-CM

## 2021-11-30 NOTE — Progress Notes (Signed)
   PRENATAL VISIT NOTE  Subjective:  Gloria Orr is a 20 y.o. G2P0010 at [redacted]w[redacted]d being seen today for ongoing prenatal care.  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  Patient reports no complaints.  Contractions: Irritability. Vag. Bleeding: None.  Movement: Present. Denies leaking of fluid.   The following portions of the patient's history were reviewed and updated as appropriate: allergies, current medications, past family history, past medical history, past social history, past surgical history and problem list.   Objective:   Vitals:   11/30/21 1606  BP: 126/79  Pulse: 97  Weight: 65.9 kg    Fetal Status: Fetal Heart Rate (bpm): 126 Fundal Height: 39 cm Movement: Present     General:  Alert, oriented and cooperative. Patient is in no acute distress.  Skin: Skin is warm and dry. No rash noted.   Cardiovascular: Normal heart rate noted  Respiratory: Normal respiratory effort, no problems with respiration noted  Abdomen: Soft, gravid, appropriate for gestational age.  Pain/Pressure: Present     Pelvic: Cervical exam performed in the presence of a chaperone Dilation: Closed Effacement (%): 30 Station: -3  Extremities: Normal range of motion.  Edema: None  Mental Status: Normal mood and affect. Normal behavior. Normal judgment and thought content.   Assessment and Plan:  Pregnancy: G2P0010 at [redacted]w[redacted]d 1. Supervision of other normal pregnancy, antepartum - routine care  2. [redacted] weeks gestation of pregnancy   Term labor symptoms and general obstetric precautions including but not limited to vaginal bleeding, contractions, leaking of fluid and fetal movement were reviewed in detail with the patient. Please  refer to After Visit Summary for other counseling recommendations.   Return in about 1 week (around 12/07/2021).  No future appointments.  Thressa Sheller DNP, CNM  11/30/21  4:21 PM

## 2021-12-07 ENCOUNTER — Other Ambulatory Visit: Payer: Self-pay

## 2021-12-07 ENCOUNTER — Ambulatory Visit (INDEPENDENT_AMBULATORY_CARE_PROVIDER_SITE_OTHER): Payer: Medicaid Other | Admitting: Student

## 2021-12-07 VITALS — BP 121/83 | HR 78 | Wt 147.0 lb

## 2021-12-07 DIAGNOSIS — Z3491 Encounter for supervision of normal pregnancy, unspecified, first trimester: Secondary | ICD-10-CM

## 2021-12-07 DIAGNOSIS — Z3A39 39 weeks gestation of pregnancy: Secondary | ICD-10-CM

## 2021-12-07 NOTE — Patient Instructions (Signed)
New Induction of Labor Process for [employer] [facility]  In Fall 2020 [facility] and [facility] changed it's process for scheduling inductions of labor to create more induction slots and to make sure patients get COVID-19 testing in advance. After you have been tested you need to quarantine so that you do not get infected after your test. You should not go anywhere after your test except necessary medical appointments.   You have been scheduled for induction of labor on 12/21. Although you may have a specific time listed on your After Visit Summary or MyChart, we cannot predict when your room will be available. Please disregard this time. A Labor and Delivery staff member will call you on the day that you are scheduled when your room is available. You will need to arrive within one hour of being called. If you do not arrive within this time frame, the next person on the list will be called in and you will move down the list. You may eat a light meal before coming to the hospital. If you go into labor, think your water has broken, experience bright red bleeding or don't feel your baby moving as much as usual before your induction, please call your Ob/Gyn's office or come to Entrance C, Maternity Assessment Unit for evaluation.  Thank you,  Center for [employer]

## 2021-12-07 NOTE — Progress Notes (Signed)
° °  PRENATAL VISIT NOTE  Subjective:  Gloria Orr is a 20 y.o. G2P0010 at [redacted]w[redacted]d being seen today for ongoing prenatal care.  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  Patient reports no complaints.  Contractions: Irritability. Vag. Bleeding: None.  Movement: Present. Denies leaking of fluid.   The following portions of the patient's history were reviewed and updated as appropriate: allergies, current medications, past family history, past medical history, past social history, past surgical history and problem list.   Objective:   Vitals:   12/07/21 1413  BP: 121/83  Pulse: 78  Weight: 147 lb (66.7 kg)    Fetal Status: Fetal Heart Rate (bpm): 120 Fundal Height: 38 cm Movement: Present     General:  Alert, oriented and cooperative. Patient is in no acute distress.  Skin: Skin is warm and dry. No rash noted.   Cardiovascular: Normal heart rate noted  Respiratory: Normal respiratory effort, no problems with respiration noted  Abdomen: Soft, gravid, appropriate for gestational age.  Pain/Pressure: Absent     Pelvic: Cervical exam deferred        Extremities: Normal range of motion.  Edema: None  Mental Status: Normal mood and affect. Normal behavior. Normal judgment and thought content.   Assessment and Plan:  Pregnancy: G2P0010 at [redacted]w[redacted]d  1. [redacted] weeks gestation of pregnancy   2. Supervision of low-risk pregnancy, first trimester    -discussed IOL with patient, patient is amenable for IOL and understands the typical process of IOL with a primip.  Explained about COVID swab as well. Explained about waiting at home for phone call.  -discussed that patient needs NST next week  -declined cervical exam  today Term labor symptoms and general obstetric precautions including but not limited to vaginal bleeding, contractions, leaking of fluid and fetal movement were reviewed in detail with the patient. Please refer to After Visit Summary for other counseling recommendations.   Return in about 3 days (around 12/10/2021).  Future Appointments  Date Time Provider Department Center  12/10/2021  8:15 AM [facility] NST [facility]  12/15/2021  7:45 AM [facility] SCHED ROOM [facility] None    Marylene Land, CNM

## 2021-12-08 ENCOUNTER — Other Ambulatory Visit: Payer: Self-pay | Admitting: Advanced Practice Midwife

## 2021-12-09 ENCOUNTER — Encounter (HOSPITAL_COMMUNITY): Payer: Self-pay | Admitting: Obstetrics & Gynecology

## 2021-12-09 ENCOUNTER — Inpatient Hospital Stay (HOSPITAL_COMMUNITY)
Admission: AD | Admit: 2021-12-09 | Discharge: 2021-12-11 | DRG: 807 | Disposition: A | Discharge status: Home / Self Care | Payer: Medicaid Other | Attending: Obstetrics and Gynecology | Admitting: Obstetrics and Gynecology

## 2021-12-09 ENCOUNTER — Other Ambulatory Visit: Payer: Self-pay

## 2021-12-09 ENCOUNTER — Encounter: Payer: Self-pay | Admitting: Student

## 2021-12-09 DIAGNOSIS — Z3A40 40 weeks gestation of pregnancy: Secondary | ICD-10-CM | POA: Diagnosis not present

## 2021-12-09 DIAGNOSIS — O43123 Velamentous insertion of umbilical cord, third trimester: Secondary | ICD-10-CM | POA: Diagnosis present

## 2021-12-09 DIAGNOSIS — Z148 Genetic carrier of other disease: Secondary | ICD-10-CM | POA: Diagnosis not present

## 2021-12-09 DIAGNOSIS — Z20822 Contact with and (suspected) exposure to covid-19: Secondary | ICD-10-CM | POA: Diagnosis present

## 2021-12-09 DIAGNOSIS — Z3491 Encounter for supervision of normal pregnancy, unspecified, first trimester: Secondary | ICD-10-CM

## 2021-12-09 DIAGNOSIS — O368130 Decreased fetal movements, third trimester, not applicable or unspecified: Principal | ICD-10-CM | POA: Diagnosis present

## 2021-12-09 DIAGNOSIS — O3510X0 Maternal care for (suspected) chromosomal abnormality in fetus, unspecified, not applicable or unspecified: Secondary | ICD-10-CM | POA: Diagnosis present

## 2021-12-09 LAB — RESP PANEL BY RT-PCR (FLU A&B, COVID) ARPGX2
Influenza A by PCR: NEGATIVE
Influenza B by PCR: NEGATIVE
SARS Coronavirus 2 by RT PCR: NEGATIVE

## 2021-12-09 LAB — CBC
HCT: 42.1 % (ref 36.0–46.0)
Hemoglobin: 14 g/dL (ref 12.0–15.0)
MCH: 30.2 pg (ref 26.0–34.0)
MCHC: 33.3 g/dL (ref 30.0–36.0)
MCV: 90.9 fL (ref 80.0–100.0)
Platelets: 179 10*3/uL (ref 150–400)
RBC: 4.63 MIL/uL (ref 3.87–5.11)
RDW: 13.5 % (ref 11.5–15.5)
WBC: 6.9 10*3/uL (ref 4.0–10.5)
nRBC: 0 % (ref 0.0–0.2)

## 2021-12-09 LAB — TYPE AND SCREEN
ABO/RH(D): O POS
Antibody Screen: NEGATIVE

## 2021-12-09 MED ORDER — ACETAMINOPHEN 325 MG PO TABS: 650.0000 mg | ORAL_TABLET | ORAL | Status: DC | PRN

## 2021-12-09 MED ORDER — SOD CITRATE-CITRIC ACID 500-334 MG/5ML PO SOLN: 30.0000 mL | ORAL | Status: DC | PRN

## 2021-12-09 MED ORDER — OXYCODONE-ACETAMINOPHEN 5-325 MG PO TABS: 2.0000 | ORAL_TABLET | ORAL | Status: DC | PRN

## 2021-12-09 MED ORDER — OXYTOCIN BOLUS FROM INFUSION: 333.0000 mL | Freq: Once | INTRAVENOUS | Status: DC

## 2021-12-09 MED ORDER — TERBUTALINE SULFATE 1 MG/ML IJ SOLN: 0.2500 mg | Freq: Once | INTRAMUSCULAR | Status: DC | PRN

## 2021-12-09 MED ORDER — ONDANSETRON HCL 4 MG/2ML IJ SOLN
4.0000 mg | Freq: Four times a day (QID) | INTRAMUSCULAR | Status: DC | PRN
  Filled 2021-12-09: qty 2

## 2021-12-09 MED ORDER — OXYCODONE-ACETAMINOPHEN 5-325 MG PO TABS: 1.0000 | ORAL_TABLET | ORAL | Status: DC | PRN

## 2021-12-09 MED ORDER — FENTANYL CITRATE (PF) 100 MCG/2ML IJ SOLN
50.0000 ug | INTRAMUSCULAR | Status: DC | PRN
Start: 2021-12-09 — End: 2021-12-10
  Administered 2021-12-09: 50 ug via INTRAVENOUS
  Filled 2021-12-09: qty 2

## 2021-12-09 MED ORDER — LACTATED RINGERS IV SOLN: 500.0000 mL | INTRAVENOUS | Status: DC | PRN

## 2021-12-09 MED ORDER — MISOPROSTOL 50MCG HALF TABLET
50.0000 ug | ORAL_TABLET | ORAL | Status: DC | PRN
  Administered 2021-12-09: 50 ug via BUCCAL
  Filled 2021-12-09: qty 1

## 2021-12-09 MED ORDER — OXYTOCIN-SODIUM CHLORIDE 30-0.9 UT/500ML-% IV SOLN: 2.5000 [IU]/h | INTRAVENOUS | Status: DC

## 2021-12-09 MED ORDER — LIDOCAINE HCL (PF) 1 % IJ SOLN: 30.0000 mL | INTRAMUSCULAR | Status: DC | PRN

## 2021-12-09 MED ORDER — LACTATED RINGERS IV SOLN
INTRAVENOUS | Status: DC
  Administered 2021-12-09: 950 mL via INTRAVENOUS

## 2021-12-09 MED ORDER — MISOPROSTOL 25 MCG QUARTER TABLET: 25.0000 ug | ORAL_TABLET | ORAL | Status: DC | PRN

## 2021-12-09 NOTE — MAU Note (Signed)
Presents with c/o decreased FM, reports has felt movement but less than usual.  Reports did fetal kick count, had 4 movements in 2 hours.  Denies VB or LOF.

## 2021-12-09 NOTE — H&P (Signed)
OBSTETRIC ADMISSION HISTORY AND PHYSICAL  Gloria Orr is a 20 y.o. female G2P0010 with IUP at [redacted]w[redacted]d by LMP presenting for IOL 2/2 DFM. She reports No LOF, no VB, no blurry vision, headaches or peripheral edema, and RUQ pain.  She plans on breast feeding. She request natural family planning for birth control. 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  Past Medical History: Past Medical History:  Diagnosis Date   Chronic headaches    Depression with suicidal ideation    Eczema    Seborrheic keratosis of scalp    Sinusitis    Stress incontinence    Past Surgical History: Past Surgical History:  Procedure Laterality Date   NO PAST SURGERIES      Obstetrical History: OB History     Gravida  2   Para  0   Term  0   Preterm  0   AB  1   Living  0      SAB  1   IAB  0   Ectopic  0   Multiple  0   Live Births  0           Social History Social History   Socioeconomic History   Marital status: Married    Spouse name: Not on file   Number of children: 0   Years of education: Not on file   Highest education level: Not on file  Occupational History   Occupation: 70%    Comment: [employer]  Tobacco Use   Smoking status: Never   Smokeless tobacco: Never  Vaping Use   Vaping Use: Never used  Substance and Sexual Activity   Alcohol use: Not Currently   Drug use: Never   Sexual activity: Yes  Other Topics Concern   Not on file  Social History Narrative   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 [place]   Currently unemployed   Lives   Caffeine use:   Social Determinants of Health   Financial Resource Strain: Not on file  Food Insecurity: No Food Insecurity   Worried About [place] in the  Last Year: Never true   Ran Out of Food in the Last Year: Never true  Transportation Needs: No Transportation Needs   Lack of Transportation (Medical): No   Lack of Transportation (Non-Medical): No  Physical Activity: Not on file  Stress: Not on file  Social Connections: Not on file    Family History: Family History  Problem Relation Age of Onset   Asthma Mother    Hypertension Father    Diabetes Father    Heart disease Sister    Asthma Sister    Asthma Brother    Allergies: No Known Allergies  Medications Prior to Admission  Medication Sig Dispense Refill Last Dose   Prenatal Vit-Fe Fumarate-FA (PRENATAL VITAMINS) 28-0.8 MG TABS Take 1 tablet by mouth daily. 60 tablet 3 12/09/2021   Misc. Devices (GOJJI WEIGHT SCALE) MISC 1 Device by Does not apply route once a week. Please check weight once a week and record in Babyscripts. (Patient not taking: Reported on 09/15/2021) 1 each 0    Review of Systems  All systems reviewed and negative except as stated in HPI  Blood pressure 129/85, pulse 91,  temperature 98.8 F (37.1 C), temperature source Oral, resp. rate 19, height 5\' 2"  (1.575 m), weight 66.7 kg, last menstrual period 03/03/2021, SpO2 99 %. General appearance: alert, cooperative, and no distress Lungs: no dyspnea Heart: regular rate  Abdomen: soft, non-tender; gravid abdomen Extremities: Homans sign is negative, no sign of DVT Presentation: cephalic Fetal monitoringBaseline: 140 bpm, Variability: Good {> 6 bpm), Accelerations: Reactive, and Decelerations: Absent Uterine activityNone Dilation: 1.5 Exam by:: Katherine,CNM  Prenatal labs: ABO, Rh: --/--/PENDING (12/15 1639) Antibody: PENDING (12/15 1639) Rubella: 19.90 (06/08 1552) RPR: Non Reactive (09/21 0852)  HBsAg: Negative (06/08 1552)  HIV: Non Reactive (09/21 0852)  GBS: Negative/-- (11/16 1154)  2 hr Glucola normal Genetic screening atypical NIPS, AFP negative, Horizon: SMA carrier Anatomy 03-31-2000  normal  Prenatal Transfer Tool  Maternal Diabetes: No Genetic Screening: Abnormal:  Results: Other: atypical NIPS, AFP negative, 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 G2P0010 at [redacted]w[redacted]d here for IOL 2/2 DFM  #Labor:early latent labor. FB placed. Buccal Cytotec, can redose q4h until balloon is out.  #Pain: IV pain med/epidural on request  #FWB: Cat 1 #ID: GBS neg #MOF: breast  #Abnormal Genetic Screening: cfDNA showed atypical findings that was outside the scope of the test. Was collected around 12-13 weeks. Offered amniocentesis but declined. Followed by MFM for interval growth ultrasounds which were all normal.  #Marginal Cord Insertion  [redacted]w[redacted]d, MD PGY-2. 12/09/2021, 5:05 PM

## 2021-12-09 NOTE — Progress Notes (Signed)
Labor Progress Note Gloria Orr is a 20 y.o. G2P0010 at [redacted]w[redacted]d presented for IOL d/t DFM.   S: Doing well, FB just fell out about 20 minutes ago. Feeling contractions stronger.   O:  BP 136/79 (BP Location: Right Arm)    Pulse 92    Temp 98.8 F (37.1 C) (Oral)    Resp 16    Ht 5\' 2"  (1.575 m)    Wt 66.7 kg    LMP 03/03/2021    SpO2 99%    BMI 26.89 kg/m  EFM: 130/mod/15x15/none Ctx every 2-3 minutes   CVE: Dilation: 5 Effacement (%): 80 Station: -2 Presentation: Vertex Exam by:: Jacobey Gura, DO   A&P: 20 y.o. G2P0010 [redacted]w[redacted]d  #Labor: Progressing well s/p FB + cytotec. Nice contraction pattern without augmentation currently, discussed options now including AROM vs allowing expectant management. She would like to continue with expectant management and reassess in about 2 hours for AROM if not ROM prior to or sooner if contractions decreasing/spacing.  #Pain: Nitrous  #FWB: Cat 1  #GBS negative   [redacted]w[redacted]d, DO 9:57 PM

## 2021-12-10 ENCOUNTER — Encounter (HOSPITAL_COMMUNITY): Payer: Self-pay | Admitting: Student

## 2021-12-10 ENCOUNTER — Inpatient Hospital Stay (HOSPITAL_COMMUNITY): Payer: Medicaid Other | Admitting: Anesthesiology

## 2021-12-10 ENCOUNTER — Encounter (HOSPITAL_COMMUNITY)
Admission: AD | Disposition: A | Discharge status: Home / Self Care | Payer: Self-pay | Source: Home / Self Care | Attending: Obstetrics and Gynecology

## 2021-12-10 ENCOUNTER — Other Ambulatory Visit: Payer: Medicaid Other

## 2021-12-10 DIAGNOSIS — O368130 Decreased fetal movements, third trimester, not applicable or unspecified: Secondary | ICD-10-CM

## 2021-12-10 DIAGNOSIS — Z3A40 40 weeks gestation of pregnancy: Secondary | ICD-10-CM

## 2021-12-10 LAB — RPR: RPR Ser Ql: NONREACTIVE

## 2021-12-10 SURGERY — Surgical Case
Anesthesia: Regional

## 2021-12-10 MED ORDER — SODIUM CHLORIDE 0.9% FLUSH: 3.0000 mL | INTRAVENOUS | Status: DC | PRN

## 2021-12-10 MED ORDER — EPHEDRINE 5 MG/ML INJ: 10.0000 mg | INTRAVENOUS | Status: DC | PRN

## 2021-12-10 MED ORDER — FENTANYL-BUPIVACAINE-NACL 0.5-0.125-0.9 MG/250ML-% EP SOLN
EPIDURAL | Status: DC | PRN
  Administered 2021-12-10: 12 mL/h via EPIDURAL

## 2021-12-10 MED ORDER — LACTATED RINGERS IV SOLN: INTRAVENOUS | Status: DC

## 2021-12-10 MED ORDER — ONDANSETRON HCL 4 MG/2ML IJ SOLN: 4.0000 mg | INTRAMUSCULAR | Status: DC | PRN

## 2021-12-10 MED ORDER — PHENYLEPHRINE 40 MCG/ML (10ML) SYRINGE FOR IV PUSH (FOR BLOOD PRESSURE SUPPORT): 80.0000 ug | PREFILLED_SYRINGE | INTRAVENOUS | Status: DC | PRN

## 2021-12-10 MED ORDER — COCONUT OIL OIL: 1.0000 "application " | TOPICAL_OIL | Status: DC | PRN

## 2021-12-10 MED ORDER — SODIUM CHLORIDE 0.9 % IV SOLN: 250.0000 mL | INTRAVENOUS | Status: DC | PRN

## 2021-12-10 MED ORDER — DIBUCAINE (PERIANAL) 1 % EX OINT: 1.0000 "application " | TOPICAL_OINTMENT | CUTANEOUS | Status: DC | PRN

## 2021-12-10 MED ORDER — PRENATAL MULTIVITAMIN CH
1.0000 | ORAL_TABLET | Freq: Every day | ORAL | Status: DC
  Administered 2021-12-10 – 2021-12-11 (×2): 1 via ORAL
  Filled 2021-12-10 (×2): qty 1

## 2021-12-10 MED ORDER — OXYCODONE-ACETAMINOPHEN 5-325 MG PO TABS: 1.0000 | ORAL_TABLET | ORAL | Status: DC | PRN

## 2021-12-10 MED ORDER — FENTANYL-BUPIVACAINE-NACL 0.5-0.125-0.9 MG/250ML-% EP SOLN
12.0000 mL/h | EPIDURAL | Status: DC | PRN
  Filled 2021-12-10: qty 250

## 2021-12-10 MED ORDER — BENZOCAINE-MENTHOL 20-0.5 % EX AERO
1.0000 "application " | INHALATION_SPRAY | CUTANEOUS | Status: DC | PRN
  Administered 2021-12-10: 1 via TOPICAL
  Filled 2021-12-10: qty 56

## 2021-12-10 MED ORDER — DIPHENHYDRAMINE HCL 50 MG/ML IJ SOLN: 12.5000 mg | INTRAMUSCULAR | Status: DC | PRN

## 2021-12-10 MED ORDER — TETANUS-DIPHTH-ACELL PERTUSSIS 5-2.5-18.5 LF-MCG/0.5 IM SUSY: 0.5000 mL | PREFILLED_SYRINGE | Freq: Once | INTRAMUSCULAR | Status: DC

## 2021-12-10 MED ORDER — LIDOCAINE HCL (PF) 1 % IJ SOLN: 30.0000 mL | INTRAMUSCULAR | Status: DC | PRN

## 2021-12-10 MED ORDER — ACETAMINOPHEN 325 MG PO TABS: 650.0000 mg | ORAL_TABLET | ORAL | Status: DC | PRN

## 2021-12-10 MED ORDER — IBUPROFEN 600 MG PO TABS
600.0000 mg | ORAL_TABLET | Freq: Four times a day (QID) | ORAL | Status: DC
  Administered 2021-12-10 – 2021-12-11 (×5): 600 mg via ORAL
  Filled 2021-12-10 (×5): qty 1

## 2021-12-10 MED ORDER — ACETAMINOPHEN 325 MG PO TABS: 650.0000 mg | ORAL_TABLET | Freq: Four times a day (QID) | ORAL | Status: DC | PRN

## 2021-12-10 MED ORDER — SOD CITRATE-CITRIC ACID 500-334 MG/5ML PO SOLN: 30.0000 mL | ORAL | Status: DC | PRN

## 2021-12-10 MED ORDER — SIMETHICONE 80 MG PO CHEW: 80.0000 mg | CHEWABLE_TABLET | ORAL | Status: DC | PRN

## 2021-12-10 MED ORDER — ZOLPIDEM TARTRATE 5 MG PO TABS: 5.0000 mg | ORAL_TABLET | Freq: Every evening | ORAL | Status: DC | PRN

## 2021-12-10 MED ORDER — LACTATED RINGERS IV SOLN: 500.0000 mL | Freq: Once | INTRAVENOUS | Status: DC

## 2021-12-10 MED ORDER — LIDOCAINE HCL (PF) 1 % IJ SOLN
INTRAMUSCULAR | Status: DC | PRN
  Administered 2021-12-10: 10 mL via EPIDURAL
  Administered 2021-12-10: 2 mL via EPIDURAL

## 2021-12-10 MED ORDER — OXYTOCIN-SODIUM CHLORIDE 30-0.9 UT/500ML-% IV SOLN
2.5000 [IU]/h | INTRAVENOUS | Status: DC
  Administered 2021-12-10: 2.5 [IU]/h via INTRAVENOUS
  Filled 2021-12-10: qty 500

## 2021-12-10 MED ORDER — DIPHENHYDRAMINE HCL 25 MG PO CAPS: 25.0000 mg | ORAL_CAPSULE | Freq: Four times a day (QID) | ORAL | Status: DC | PRN

## 2021-12-10 MED ORDER — ONDANSETRON HCL 4 MG/2ML IJ SOLN: 4.0000 mg | Freq: Four times a day (QID) | INTRAMUSCULAR | Status: DC | PRN

## 2021-12-10 MED ORDER — OXYTOCIN BOLUS FROM INFUSION
333.0000 mL | Freq: Once | INTRAVENOUS | Status: AC
  Administered 2021-12-10: 333 mL via INTRAVENOUS

## 2021-12-10 MED ORDER — MEASLES, MUMPS & RUBELLA VAC IJ SOLR: 0.5000 mL | Freq: Once | INTRAMUSCULAR | Status: DC

## 2021-12-10 MED ORDER — ONDANSETRON HCL 4 MG PO TABS: 4.0000 mg | ORAL_TABLET | ORAL | Status: DC | PRN

## 2021-12-10 MED ORDER — WITCH HAZEL-GLYCERIN EX PADS
1.0000 "application " | MEDICATED_PAD | CUTANEOUS | Status: DC | PRN
  Administered 2021-12-10: 1 via TOPICAL

## 2021-12-10 MED ORDER — SODIUM CHLORIDE 0.9% FLUSH: 3.0000 mL | Freq: Two times a day (BID) | INTRAVENOUS | Status: DC

## 2021-12-10 MED ORDER — FENTANYL CITRATE (PF) 100 MCG/2ML IJ SOLN: 50.0000 ug | INTRAMUSCULAR | Status: DC | PRN

## 2021-12-10 MED ORDER — SENNOSIDES-DOCUSATE SODIUM 8.6-50 MG PO TABS
2.0000 | ORAL_TABLET | ORAL | Status: DC
  Administered 2021-12-10 – 2021-12-11 (×2): 2 via ORAL
  Filled 2021-12-10 (×2): qty 2

## 2021-12-10 MED ORDER — LACTATED RINGERS IV SOLN: 500.0000 mL | INTRAVENOUS | Status: DC | PRN

## 2021-12-10 MED ORDER — OXYCODONE-ACETAMINOPHEN 5-325 MG PO TABS: 2.0000 | ORAL_TABLET | ORAL | Status: DC | PRN

## 2021-12-10 NOTE — Lactation Note (Signed)
This note was copied from a baby's chart. Lactation Consultation Note  Patient Name: Gloria Orr EUMPN'T Date: 12/10/2021 Reason for consult: Initial assessment;1st time breastfeeding;Term Age:22 hours P1, term female infant. Per mom, it is time for a feeding, infant was cuing. Per mom, infant will not open her mouth to feed.  LC did suck training at first and infant only held finger in mouth, afterwards, mom self expressed 3 mls of colostrum by spoon.  Mom pre-pump breast prior to latching infant, infant latched on mom's right breat using the cross cradle hold, infant latched with depth and breastfeed for 10 minutes. Mom pre-pump left breast and latch infant again in cross cradle hold, swallows observed and infant was still breastfeeding after 18 minutes when LC left the room. Mom's plan: 1- Mom will breastfeed infant with feeding cues, 8 to 12 times within 24 hours, skin to skin. 2- Mom will pre-pump with hand pump prior to latching infant at the breast. 3- Mom knows to ask RN/LC for further latch assistance if needed.  Maternal Data Has patient been taught Hand Expression?: Yes Does the patient have breastfeeding experience prior to this delivery?: No  Feeding Mother's Current Feeding Choice: Breast Milk  LATCH Score Latch: Grasps breast easily, tongue down, lips flanged, rhythmical sucking.  Audible Swallowing: Spontaneous and intermittent  Type of Nipple: Flat  Comfort (Breast/Nipple): Soft / non-tender  Hold (Positioning): Assistance needed to correctly position infant at breast and maintain latch.  LATCH Score: 8   Lactation Tools Discussed/Used Tools: Pump Breast pump type: Manual Pump Education: Setup, frequency, and cleaning;Milk Storage Reason for Pumping: pre-pump to help evert nipple shaft out more prior to latching infant. Pumping frequency: pre-pump prior to latching infant at the breast.  Interventions Interventions: Breast feeding basics  reviewed;Assisted with latch;Skin to skin;Hand express;Pre-pump if needed;Breast compression;Adjust position;Support pillows;Position options;Expressed milk;Hand pump;Education;LC Services brochure  Discharge Pump: Manual  Consult Status Consult Status: Follow-up Date: 12/11/21 Follow-up type: In-patient    Danelle Earthly 12/10/2021, 5:21 PM

## 2021-12-10 NOTE — Progress Notes (Signed)
CSW received consult for hx of Depression.  CSW met with MOB to offer support and complete assessment, MOB's mother present. CSW introduced self and explained role. MOB granted CSW verbal permission to speak in front of MOB's mother about anything. MOB was welcoming, pleasant, and remained engaged during assessment. CSW and MOB discussed MOB's mental health history. MOB reported that she has been experiencing depression on and off since 2017. MOB reported that she was recently diagnosed 5 months ago when she seen someone for the first time. MOB described her depression as being sad and denied any current symptoms. MOB reported that she was participating in therapy through her OBGYN office but she didn't find it helpful. MOB denied needing any additional therapy resources. MOB reported that she is not taking any medication to treat depression. CSW inquired about MOB's coping skills, MOB reported that her supports (family and friends) and religous practices are helpful for coping. CSW inquired about MOB's support system, MOB reported that her husband and family are supports. MOB denied any additional mental health history. CSW inquired about how MOB was feeling emotionally after giving birth, MOB reported that she was feeling excited and great. MOB presented calm and did not demonstrate any acute mental health signs/symptoms. CSW assessed for safety, MOB denied SI, HI, and domestic violence. CSW informed MOB that due to her mental health history she may be more susceptible to postpartum depression.    CSW provided education regarding the baby blues period vs. perinatal mood disorders, discussed treatment and gave resources for mental health follow up if concerns arise.  CSW recommends self-evaluation during the postpartum time period using the New Mom Checklist from Postpartum Progress and encouraged MOB to contact a medical professional if symptoms are noted at any time.    CSW provided review of Sudden Infant  Death Syndrome (SIDS) precautions. MOB verbalized understanding and reported that they have all items needed to care for infant including a car seat and crib.   CSW identifies no further need for intervention and no barriers to discharge at this time.  Abundio Miu, [employer] Worker [facility] Cell#: [phone]

## 2021-12-10 NOTE — Discharge Summary (Signed)
Postpartum Discharge Summary  Date of Service updated-yes     Patient Name: Gloria Orr DOB: 2001-11-14 MRN: [id number]  Date of admission: 12/09/2021 Delivery date:12/10/2021  Delivering provider: Patriciaann Clan  Date of discharge: 12/11/2021  Admitting diagnosis: Indication for care in labor or delivery [O75.9] Intrauterine pregnancy: [redacted]w[redacted]d    Secondary diagnosis:  Principal Problem:   Indication for care in labor or delivery Active Problems:   SVD (spontaneous vaginal delivery)  Additional problems: abnormal genetic screen, MCI, SMA carrier    Discharge diagnosis: Term Pregnancy Delivered                                              Post partum procedures: none Augmentation: Cytotec and IP Foley Complications: None  Hospital course: Induction of Labor With Vaginal Delivery   20y.o. yo G2P0010 at 426w2das admitted to the hospital 12/09/2021 for induction of labor.  Indication for induction:  DFM .  Patient had an uncomplicated labor course as follows: Membrane Rupture Time/Date: 1:35 AM ,12/10/2021   Delivery Method:Vaginal, Spontaneous  Episiotomy: None  Lacerations:  2nd degree;Perineal  Details of delivery can be found in separate delivery note.  Patient had a routine postpartum course. Patient is discharged home 12/11/21.  Newborn Data: Birth date:12/10/2021  Birth time:5:40 AM  Gender:Female  Living status:Living  Apgars:9 ,9  Weight:2730 g   Magnesium Sulfate received: No BMZ received: No Rhophylac:No MMR:No T-DaP:Given prenatally Flu: Yes Transfusion:No  Physical exam  Vitals:   12/10/21 1410 12/10/21 1815 12/10/21 2241 12/11/21 0540  BP: 115/68 109/65 119/60 111/76  Pulse: 72 95 86 85  Resp: 18 16 18 19   Temp: 98.9 F (37.2 C) 99.5 F (37.5 C) 98 F (36.7 C) 98.7 F (37.1 C)  TempSrc: Oral Oral Oral Oral  SpO2: 100% 100% 100% 99%  Weight:      Height:       General: alert, cooperative, and no distress Lochia:  appropriate Uterine Fundus: firm Incision: N/A DVT Evaluation: No evidence of DVT seen on physical exam. Negative Homan's sign. No cords or calf tenderness. No significant calf/ankle edema. Labs: Lab Results  Component Value Date   WBC 6.9 12/09/2021   HGB 14.0 12/09/2021   HCT 42.1 12/09/2021   MCV 90.9 12/09/2021   PLT 179 12/09/2021   No flowsheet data found. Edinburgh Score: Edinburgh Postnatal Depression Scale Screening Tool 12/10/2021  I have been able to laugh and see the funny side of things. (No Data)  I have looked forward with enjoyment to things. -  I have blamed myself unnecessarily when things went wrong. -  I have been anxious or worried for no good reason. -  I have felt scared or panicky for no good reason. -  Things have been getting on top of me. -  I have been so unhappy that I have had difficulty sleeping. -  I have felt sad or miserable. -  I have been so unhappy that I have been crying. -  The thought of harming myself has occurred to me. - Flavia Shipperostnatal Depression Scale Total -     After visit meds:  Allergies as of 12/11/2021   No Known Allergies      Medication List     TAKE these medications    Gojji Weight Scale Misc 1 Device by Does not  apply route once a week. Please check weight once a week and record in Babyscripts.   ibuprofen 600 MG tablet Commonly known as: ADVIL Take 1 tablet (600 mg total) by mouth every 6 (six) hours.   Prenatal Vitamins 28-0.8 MG Tabs Take 1 tablet by mouth daily.         Discharge home in stable condition Infant Feeding: Breast Infant Disposition:home with mother Discharge instruction: per After Visit Summary and Postpartum booklet. Activity: Advance as tolerated. Pelvic rest for 6 weeks.  Diet: routine diet Future Appointments: Future Appointments  Date Time Provider [place]  01/20/2022  9:15 AM Starr Lake, CNM [facility]   Follow up Visit:  Message sent  to [facility] by Dr Higinio Plan: Please schedule this patient for a In person postpartum visit in 6 weeks with the following provider:  Request for Maye Hides please . Additional Postpartum F/U: None   Low risk pregnancy complicated by:  Marginal cord insertion Delivery mode:  Vaginal, Spontaneous  Anticipated Birth Control:  Unsure   12/11/2021 Julianne Handler, CNM

## 2021-12-10 NOTE — Anesthesia Postprocedure Evaluation (Signed)
Anesthesia Post Note  Patient: Gloria Orr  Procedure(s) Performed: AN AD HOC LABOR EPIDURAL     Patient location during evaluation: Mother Baby Anesthesia Type: Epidural Level of consciousness: awake and alert Pain management: pain level controlled Vital Signs Assessment: post-procedure vital signs reviewed and stable Respiratory status: spontaneous breathing, nonlabored ventilation and respiratory function stable Cardiovascular status: stable Postop Assessment: no headache, no backache, epidural receding, no apparent nausea or vomiting, patient able to bend at knees, adequate PO intake and able to ambulate Anesthetic complications: no   No notable events documented.  Last Vitals:  Vitals:   12/10/21 0850 12/10/21 0945  BP: 107/69 (!) 110/58  Pulse: 78 82  Resp: 18 16  Temp: 37.3 C 37.2 C  SpO2: 100% 100%    Last Pain:  Vitals:   12/10/21 0945  TempSrc: Oral  PainSc: 0-No pain   Pain Goal:                   [place]

## 2021-12-10 NOTE — Anesthesia Preprocedure Evaluation (Signed)
Anesthesia Evaluation  Patient identified by MRN, date of birth, ID band Patient awake    Reviewed: Allergy & Precautions, Patient's Chart, lab work & pertinent test results  Airway Mallampati: II  TM Distance: >3 FB Neck ROM: Full    Dental no notable dental hx.    Pulmonary neg pulmonary ROS,    Pulmonary exam normal breath sounds clear to auscultation       Cardiovascular negative cardio ROS Normal cardiovascular exam Rhythm:Regular Rate:Normal     Neuro/Psych  Headaches, PSYCHIATRIC DISORDERS Depression    GI/Hepatic negative GI ROS, Neg liver ROS,   Endo/Other  negative endocrine ROS  Renal/GU negative Renal ROS Bladder dysfunction      Musculoskeletal negative musculoskeletal ROS (+)   Abdominal   Peds negative pediatric ROS (+)  Hematology negative hematology ROS (+) hct 42.1, plt 179   Anesthesia Other Findings   Reproductive/Obstetrics (+) Pregnancy                             Anesthesia Physical Anesthesia Plan  ASA: 2  Anesthesia Plan: Epidural   Post-op Pain Management:    Induction:   PONV Risk Score and Plan: 2  Airway Management Planned: Natural Airway  Additional Equipment: None  Intra-op Plan:   Post-operative Plan:   Informed Consent: I have reviewed the patients History and Physical, chart, labs and discussed the procedure including the risks, benefits and alternatives for the proposed anesthesia with the patient or authorized representative who has indicated his/her understanding and acceptance.       Plan Discussed with:   Anesthesia Plan Comments:         Anesthesia Quick Evaluation

## 2021-12-10 NOTE — Anesthesia Procedure Notes (Signed)
Epidural Patient location during procedure: OB Start time: 12/10/2021 1:16 AM End time: 12/10/2021 1:24 AM  Staffing Anesthesiologist: Lannie Fields, DO Performed: anesthesiologist   Preanesthetic Checklist Completed: patient identified, IV checked, risks and benefits discussed, monitors and equipment checked, pre-op evaluation and timeout performed  Epidural Patient position: sitting Prep: DuraPrep and site prepped and draped Patient monitoring: continuous pulse ox, blood pressure, heart rate and cardiac monitor Approach: midline Location: L3-L4 Injection technique: LOR air  Needle:  Needle type: Tuohy  Needle gauge: 17 G Needle length: 9 cm Needle insertion depth: 4.5 cm Catheter type: closed end flexible Catheter size: 19 Gauge Catheter at skin depth: 10 cm Test dose: negative  Assessment Sensory level: T8 Events: blood not aspirated, injection not painful, no injection resistance, no paresthesia and negative IV test  Additional Notes Patient identified. Risks/Benefits/Options discussed with patient including but not limited to bleeding, infection, nerve damage, paralysis, failed block, incomplete pain control, headache, blood pressure changes, nausea, vomiting, reactions to medication both or allergic, itching and postpartum back pain. Confirmed with bedside nurse the patient's most recent platelet count. Confirmed with patient that they are not currently taking any anticoagulation, have any bleeding history or any family history of bleeding disorders. Patient expressed understanding and wished to proceed. All questions were answered. Sterile technique was used throughout the entire procedure. Please see nursing notes for vital signs. Test dose was given through epidural catheter and negative prior to continuing to dose epidural or start infusion. Warning signs of high block given to the patient including shortness of breath, tingling/numbness in hands, complete motor  block, or any concerning symptoms with instructions to call for help. Patient was given instructions on fall risk and not to get out of bed. All questions and concerns addressed with instructions to call with any issues or inadequate analgesia.  Reason for block:procedure for pain

## 2021-12-11 MED ORDER — IBUPROFEN 600 MG PO TABS: 600.0000 mg | ORAL_TABLET | Freq: Four times a day (QID) | ORAL | 0 refills | Status: DC

## 2021-12-15 ENCOUNTER — Inpatient Hospital Stay (HOSPITAL_COMMUNITY): Admission: AD | Admit: 2021-12-15 | Payer: Medicaid Other | Source: Home / Self Care | Admitting: Family Medicine

## 2021-12-15 ENCOUNTER — Inpatient Hospital Stay (HOSPITAL_COMMUNITY): Payer: Medicaid Other

## 2021-12-23 ENCOUNTER — Telehealth (HOSPITAL_COMMUNITY): Payer: Self-pay | Admitting: *Deleted

## 2021-12-23 NOTE — Telephone Encounter (Signed)
Attempted Hospital Discharge Follow-Up Call.  Left voice mail requesting that patient return RN's phone call.  

## 2022-01-20 ENCOUNTER — Ambulatory Visit: Payer: Self-pay | Admitting: Student

## 2022-02-08 ENCOUNTER — Ambulatory Visit (INDEPENDENT_AMBULATORY_CARE_PROVIDER_SITE_OTHER): Payer: Medicaid Other | Admitting: Nurse Practitioner

## 2022-02-08 ENCOUNTER — Other Ambulatory Visit: Payer: Self-pay

## 2022-02-08 ENCOUNTER — Encounter: Payer: Self-pay | Admitting: Nurse Practitioner

## 2022-02-08 DIAGNOSIS — F53 Postpartum depression: Secondary | ICD-10-CM

## 2022-02-08 DIAGNOSIS — Z3202 Encounter for pregnancy test, result negative: Secondary | ICD-10-CM | POA: Diagnosis not present

## 2022-02-08 DIAGNOSIS — M545 Low back pain, unspecified: Secondary | ICD-10-CM

## 2022-02-08 DIAGNOSIS — N3946 Mixed incontinence: Secondary | ICD-10-CM | POA: Diagnosis not present

## 2022-02-08 DIAGNOSIS — R102 Pelvic and perineal pain: Secondary | ICD-10-CM

## 2022-02-08 DIAGNOSIS — G8929 Other chronic pain: Secondary | ICD-10-CM

## 2022-02-08 LAB — POCT PREGNANCY, URINE: Preg Test, Ur: NEGATIVE

## 2022-02-08 NOTE — Progress Notes (Signed)
Post Partum Visit Note  Gloria Orr is a 21 y.o. G52P1011 female who presents for a postpartum visit. She is 8 weeks 4 days postpartum following a normal spontaneous vaginal delivery.  I have fully reviewed the prenatal and intrapartum course. The delivery was at 40w 2d.  Anesthesia: epidural. Postpartum course has been good. Baby is doing well. Baby is feeding by breast. Bleeding no bleeding. Bowel function is abnormal; reports constipation and hemorrhoids. Bladder function is normal. Patient is sexually active. Desired Contraception method is IUD.Liletta.  Postpartum depression screening: positive. Reports she had pain from stitches when she attempted intercourse and was not able to have intercourse.  Wants stitches checked.. Reports mood swings.   The pregnancy intention screening data noted above was reviewed. Potential methods of contraception were discussed. The patient elected to proceed with IUD insertion.   Edinburgh Postnatal Depression Scale - 02/08/22 1733       Edinburgh Postnatal Depression Scale:  In the Past 7 Days   I have been able to laugh and see the funny side of things. 1    I have looked forward with enjoyment to things. 1    I have blamed myself unnecessarily when things went wrong. 3    I have been anxious or worried for no good reason. 3    I have felt scared or panicky for no good reason. 0    Things have been getting on top of me. 2    I have been so unhappy that I have had difficulty sleeping. 1    I have felt sad or miserable. 2    I have been so unhappy that I have been crying. 2    The thought of harming myself has occurred to me. 2    Edinburgh Postnatal Depression Scale Total 17             Health Maintenance Due  Topic Date Due   HPV VACCINES (1 - 2-dose series) Never done   INFLUENZA VACCINE  Never done    The following portions of the patient's history were reviewed and updated as appropriate: allergies, current medications, past family  history, past medical history, past social history, past surgical history, and problem list.  Review of Systems Pertinent items noted in HPI and remainder of comprehensive ROS otherwise negative.  Objective:  BP 109/61    Pulse 62    Wt 136 lb 3.2 oz (61.8 kg)    LMP 02/01/2022 (Approximate)    Breastfeeding Yes    BMI 24.91 kg/m    General:  alert, cooperative, and mild distress   Breasts:  not indicated  Lungs: clear to auscultation bilaterally  Heart:  regular rate and rhythm, S1, S2 normal, no murmur, click, rub or gallop  Abdomen: soft, non-tender; bowel sounds normal; no masses,  no organomegaly   Wound NA  GU exam:   Inspection,, no redness,, no stitches seen or palpated.  Has pain at 5:30 o'clock on perineum.  Tender to palpation.  Declines speculum exam and IUD insertion today.       Assessment:      postpartum exam with postpartum depression, incontinence and perineal pain.   Plan:   Essential components of care per ACOG recommendations:  1.  Mood and well being: Patient with positive depression screening today. Reviewed local resources for support.  - Patient tobacco use? No.   - hx of drug use? No.    2. Infant care and feeding:  -Patient currently breastmilk  feeding? Yes -Social determinants of health (SDOH) reviewed in EPIC.   3. Sexuality, contraception and birth spacing - Patient does not want a pregnancy in the next year.  - Reviewed forms of contraception in tiered fashion. Patient desired IUD today.   - Discussed birth spacing of 18 months  4. Sleep and fatigue -Encouraged family/partner/community support of 4 hrs of uninterrupted sleep to help with mood and fatigue  5. Physical Recovery  - Discussed patients delivery and complications. She describes her labor as good. - Patient had a Vaginal, no problems at delivery. Patient had a 2nd degree laceration. Perineal healing reviewed. Patient expressed understanding - Patient has urinary incontinence? Yes.  Offered PT and patient accepted. Patient was referred to pelvic floor PT.  - Patient is not safe to resume physical and sexual activity  6.  Health Maintenance - HM due items addressed Yes - Last pap smear  Diagnosis  Date Value Ref Range Status  06/02/2021   Final   - Negative for intraepithelial lesion or malignancy (NILM)   Pap smear not done at today's visit.  -Breast Cancer screening indicated? No.   7. Chronic Disease/Pregnancy Condition follow up:  depression Referrred to in house [facility] and given mental health resources Discussed fourth trimester as a period of adjustment Will schedule follow up to recheck perineum Advised to do kegel exercises in sets of 10 TID Will refer to pelvic PT for incontinence and back pain - had to had assistance to get up from lying down on the exam table due to back pain. Advised no intercourse until returns or intercourse with a condom and water soluble lubricant.  - PCP follow up  Currie Paris, NP Center for [employer], [facility]

## 2022-02-09 ENCOUNTER — Encounter: Payer: Self-pay | Admitting: Nurse Practitioner

## 2022-02-09 NOTE — BH Specialist Note (Signed)
Integrated Behavioral Health via Telemedicine Visit  02/21/2022 Sairah Knobloch [id number]  Number of Integrated Behavioral Health Clinician visits: 1- Initial Visit  Session Start time: 1416   Session End time: 1504  Total time in minutes: 48   Referring Provider: Nolene Bernheim, NP Patient/Family location: Home [facility] Provider location: Center for [facility] at [facility]  All persons participating in visit: Patient Gloria Orr and [facility] Samie Reasons   Types of Service: Individual psychotherapy and Video visit  I connected with Wilson Singer and/or Drue Mohammed's  n/a  via  Telephone or Video Enabled Telemedicine Application  (Video is Caregility application) and verified that I am speaking with the correct person using two identifiers. Discussed confidentiality: Yes   I discussed the limitations of telemedicine and the availability of in person appointments.  Discussed there is a possibility of technology failure and discussed alternative modes of communication if that failure occurs.  I discussed that engaging in this telemedicine visit, they consent to the provision of behavioral healthcare and the services will be billed under their insurance.  Patient and/or legal guardian expressed understanding and consented to Telemedicine visit: Yes   Presenting Concerns: Patient and/or family reports the following symptoms/concerns: Increased stress caring for baby with colic and managing household, while taking pre-med classes; Self-harm in the form of not eating for at least 12 hours daily. Husband has been very supportive.  Duration of problem: Ongoing with increase postpartum; Severity of problem:  moderately severe  Patient and/or Family's Strengths/Protective Factors: Concrete supports in place (healthy food, safe environments, etc.) and Sense of purpose  Goals Addressed: Patient will:  Reduce symptoms of: anxiety, depression, and disordered eating     Demonstrate ability to: Increase healthy adjustment to current life circumstances and Increase adequate support systems for patient/family  Progress towards Goals: Ongoing  Interventions: Interventions utilized:  Solution-Focused Strategies, Psychoeducation and/or Health Education, and Link to [employer] Assessments completed:  edinburgh given in past two weeks  Patient and/or Family Response: Pt agrees with treatment plan  Assessment: Patient currently experiencing Disordered eating, unspecified eating disorder.   Patient may benefit from psychoeducation and brief therapeutic interventions regarding coping with symptoms of depression, anxiety, disordered eating .  Plan: Follow up with behavioral health clinician on : Two weeks; Call Isair Inabinet at [phone], as needed. Behavioral recommendations:  -Accept referral to [facility] outpatient psychiatry -Discuss options with medical provider regarding increasing healthy nutrition at visit tomorrow -Consider setting timer on phone as reminder to self to eat/drink as a task, at least once during daytime; notice both physical and emotional response (ex. Headache, irritability, anger) -Above all, be kind to yourself during this difficult time. You are doing a great job taking care of your daughter.  Referral(s): Integrated Art gallery manager (In Clinic), Community Mental Health Services (LME/Outside Clinic), and Community Resources:  new mom support   I discussed the assessment and treatment plan with the patient and/or parent/guardian. They were provided an opportunity to ask questions and all were answered. They agreed with the plan and demonstrated an understanding of the instructions.   They were advised to call back or seek an in-person evaluation if the symptoms worsen or if the condition fails to improve as anticipated.  Rae Lips, LCSW  Depression screen [facility] 2/9 12/13/2021 11/22/2021 11/10/2021  10/27/2021 10/13/2021  Decreased Interest 0 0 0 0 1  Down, Depressed, Hopeless 0 0 0 0 1  PHQ - 2 Score 0 0 0 0 2  Altered sleeping 0 0 0 0 1  Tired, decreased energy 0 0 0 0 1  Change in appetite 0 0 0 0 3  Feeling bad or failure about yourself  0 0 0 0 2  Trouble concentrating 0 0 0 0 1  Moving slowly or fidgety/restless 0 0 0 0 0  Suicidal thoughts 0 0 0 0 0  PHQ-9 Score 0 0 0 0 10  Some recent data might be hidden   GAD 7 : Generalized Anxiety Score 12/13/2021 11/22/2021 11/10/2021 10/27/2021  Nervous, Anxious, on Edge 0 0 0 0  Control/stop worrying 0 0 0 0  Worry too much - different things 0 0 0 0  Trouble relaxing 0 0 0 0  Restless 0 0 0 0  Easily annoyed or irritable 0 0 0 0  Afraid - awful might happen 0 0 0 0  Total GAD 7 Score 0 0 0 0    Edinburgh Postnatal Depression Scale Screening Tool 02/08/2022 12/11/2021 12/10/2021 05/14/2020  I have been able to laugh and see the funny side of things. 1 0 (No Data) 0  I have looked forward with enjoyment to things. 1 0 - 0  I have blamed myself unnecessarily when things went wrong. 3 1 - 1  I have been anxious or worried for no good reason. 3 1 - 1  I have felt scared or panicky for no good reason. 0 0 - 1  Things have been getting on top of me. 2 0 - 0  I have been so unhappy that I have had difficulty sleeping. 1 0 - 0  I have felt sad or miserable. 2 0 - 3  I have been so unhappy that I have been crying. 2 0 - 2  The thought of harming myself has occurred to me. 2 0 - 0  Edinburgh Postnatal Depression Scale Total 17 2 - 8

## 2022-02-21 ENCOUNTER — Ambulatory Visit: Payer: Medicaid Other | Admitting: Clinical

## 2022-02-21 DIAGNOSIS — F509 Eating disorder, unspecified: Secondary | ICD-10-CM

## 2022-02-22 ENCOUNTER — Other Ambulatory Visit: Payer: Self-pay

## 2022-02-22 ENCOUNTER — Encounter: Payer: Self-pay | Admitting: Nurse Practitioner

## 2022-02-22 ENCOUNTER — Ambulatory Visit (INDEPENDENT_AMBULATORY_CARE_PROVIDER_SITE_OTHER): Payer: Medicaid Other | Admitting: Nurse Practitioner

## 2022-02-22 VITALS — BP 109/67 | HR 69 | Wt 136.0 lb

## 2022-02-22 DIAGNOSIS — M5489 Other dorsalgia: Secondary | ICD-10-CM | POA: Diagnosis not present

## 2022-02-22 DIAGNOSIS — Z3202 Encounter for pregnancy test, result negative: Secondary | ICD-10-CM | POA: Diagnosis not present

## 2022-02-22 DIAGNOSIS — Z30430 Encounter for insertion of intrauterine contraceptive device: Secondary | ICD-10-CM

## 2022-02-22 LAB — POCT PREGNANCY, URINE: Preg Test, Ur: NEGATIVE

## 2022-02-22 MED ORDER — LEVONORGESTREL 20.1 MCG/DAY IU IUD
1.0000 | INTRAUTERINE_SYSTEM | Freq: Once | INTRAUTERINE | Status: AC
  Administered 2022-02-22: 1 via INTRAUTERINE

## 2022-02-23 NOTE — Progress Notes (Signed)
Aspen Marquis Lunch 21 y.o. CC IUD insertion and back pain  IUD Insertion Procedure Note Patient identified, informed consent performed, consent signed.   Discussed risks of irregular bleeding, cramping, infection, malpositioning or misplacement of the IUD outside the uterus which may require further procedure such as laparoscopy. Time out was performed.  Urine pregnancy test negative.  Bimanual done - stool noted in rectum although patient reports having BM today.  Has back pain when lying down and legs adjusted for her to be more comfortable when lying back.  This has been a problem in pregnancy and continues at the same painful level as when she was pregnant.  Speculum placed in the vagina.  Cervix visualized.  Cleaned with Betadine x 2.  Hurricane spray used.  Grasped anteriorly with a single tooth tenaculum.  The first grab of the tenaculum was shallow and did tear through with tension on the tenaculum.  Tenaculum replaced.  Patient did not report pain.  Uterus sounded to 9 cm.  Liletta IUD placed per manufacturer's recommendations.  Strings trimmed to 3 cm. Tenaculum was removed, with pressure to tenaculum sites and with application of Monsel's, good hemostasis noted.  Patient tolerated procedure well.  She needed assistance to sit up from the table.  Patient was given post-procedure instructions.  She was advised to have backup contraception for one week.  Patient was also asked to check IUD strings periodically and follow up in 4 weeks for IUD check.   Referral made to ortho for this continued back pain that began in early pregnancy and has not abated since the birth.  Discussed taking ensure as she has little appetite - has seen BH in the office.  Is in school and is very busy.  Will send letter to patient to take to [facility],  Nolene Bernheim, NP 02-22-22  5:00pm

## 2022-02-28 ENCOUNTER — Ambulatory Visit (INDEPENDENT_AMBULATORY_CARE_PROVIDER_SITE_OTHER): Payer: Medicaid Other

## 2022-02-28 ENCOUNTER — Other Ambulatory Visit: Payer: Self-pay

## 2022-02-28 ENCOUNTER — Encounter: Payer: Self-pay | Admitting: Physician Assistant

## 2022-02-28 ENCOUNTER — Ambulatory Visit (INDEPENDENT_AMBULATORY_CARE_PROVIDER_SITE_OTHER): Payer: Medicaid Other | Admitting: Physician Assistant

## 2022-02-28 DIAGNOSIS — M545 Low back pain, unspecified: Secondary | ICD-10-CM | POA: Diagnosis not present

## 2022-02-28 DIAGNOSIS — G8929 Other chronic pain: Secondary | ICD-10-CM

## 2022-02-28 NOTE — Progress Notes (Signed)
? ?Office Visit Note ?  ?Patient: Gloria Orr           ?Date of Birth: October 11, 2001           ?MRN: [id number] ?Visit Date: 02/28/2022 ?             ?Requested by: Currie Paris, NP ?[address] ?[address] ?[place],  [place] [address] ?PCP: Patient, No Pcp Per (Inactive) ? ?Chief Complaint  ?Patient presents with  ? Lower Back - Pain  ? ? ? ? ?HPI: ?Patient is a pleasant active 21 year old woman who is 2 months postpartum.  She states that during her pregnancy she developed mid lower back pain with some reproducible symptoms in her lower back and buttock.  She denies any slips of falls or other injuries or any previous history.  She also noticed some tingling in both of her feet.  She was hoping that this would go away once she had her baby but it still continues to be persistent.  She denies any loss of bowel or bladder control.  She is not taking any anti-inflammatories because she is breast-feeding. ? ?Assessment & Plan: ?Visit Diagnoses:  ?1. Chronic bilateral low back pain, unspecified whether sciatica present   ? ? ?Plan: Patient with back pain that developed while she was pregnant continues postpartum.  X-rays are reassuring.  She does have quite a bit of gas and bowel also is difficult to evaluate her SI joint.  Otherwise her x-rays were reassuring.  I spoke with Judeth Cornfield who is one of our physical therapist.  We will put in a referral to see her.  She did say that if it is consistent with SI or pelvic floor issues they could refer her to our therapist [place].  We will follow-up with [place] in 1 month ? ?Follow-Up Instructions: No follow-ups on file.  ? ?Ortho Exam ? ?Patient is alert, oriented, no adenopathy, well-dressed, normal affect, normal respiratory effort. ?Examination healthy well-appearing 21 year old woman.  She has no significant pain with forward flexion extension or side to side bending.  She does not have numbing per se but she does have some tingling in both of her feet.  She has 5 out  of 5 strength with dorsiflexion plantarflexion of her ankles knees and flexion of her hips.  Deep tendon reflexes are equal and intact. ? ?Imaging: ?No results found. ?No images are attached to the encounter. ? ?Labs: ?Lab Results  ?Component Value Date  ? HGBA1C 4.7 (L) 06/02/2021  ? ? ? ?No results found for: ALBUMIN, PREALBUMIN, CBC ? ?No results found for: MG ?No results found for: VD25OH ? ?No results found for: PREALBUMIN ?CBC EXTENDED Latest Ref Rng & Units 12/09/2021 09/15/2021 06/02/2021  ?WBC 4.0 - 10.5 K/uL 6.9 8.6 5.3  ?RBC 3.87 - 5.11 MIL/uL 4.63 4.06 4.46  ?HGB 12.0 - 15.0 g/dL 63.0 16.0 10.9  ?HCT 36.0 - 46.0 % 42.1 35.9 37.1  ?PLT 150 - 400 K/uL 179 205 281  ?NEUTROABS 1.4 - 7.0 x10E3/uL - - 3.5  ?LYMPHSABS 0.7 - 3.1 x10E3/uL - - 1.3  ? ? ? ?There is no height or weight on file to calculate BMI. ? ?Orders:  ?Orders Placed This Encounter  ?Procedures  ? XR Lumbar Spine 2-3 Views  ? Ambulatory referral to Physical Therapy  ? ?No orders of the defined types were placed in this encounter. ? ? ? Procedures: ?No procedures performed ? ?Clinical Data: ?No additional findings. ? ?ROS: ? ?All other systems negative,  except as noted in the HPI. ?Review of Systems ? ?Objective: ?Vital Signs: LMP 02/01/2022 (Approximate)  ? ?Specialty Comments:  ?No specialty comments available. ? ?PMFS History: ?Patient Active Problem List  ? Diagnosis Date Noted  ? Postpartum depression 02/08/2022  ? 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 ?Past Medical History:  ?Diagnosis Date  ? Chronic headaches   ? Depression with suicidal ideation   ? Eczema   ? Seborrheic keratosis of scalp   ? Sinusitis   ? Stress incontinence   ?  ?Family History  ?Problem Relation Age of Onset  ? Asthma Mother   ? Hypertension Father   ? Diabetes Father   ? Heart disease Sister   ? Asthma Sister   ? Asthma Brother   ?  ?Past Surgical History:  ?Procedure Laterality Date  ? NO  PAST SURGERIES    ? ?Social History  ? ?Occupational History  ? Occupation: Ecologist  ?  Comment: Language Resources  ?Tobacco Use  ? Smoking status: Never  ? Smokeless tobacco: Never  ?Vaping Use  ? Vaping Use: Never used  ?Substance and Sexual Activity  ? Alcohol use: Not Currently  ? Drug use: Never  ? Sexual activity: Yes  ? ? ? ? ? ?

## 2022-03-16 NOTE — Therapy (Addendum)
?OUTPATIENT PHYSICAL THERAPY THORACOLUMBAR EVALUATION ? ? ?Patient Name: Gloria Orr ?MRN: [id number] ?DOB:April 22, 2001, 21 y.o., female ?Today's Date: 03/17/2022 ? ? PT End of Session - 03/17/22 1655   ? ? Visit Number 1   ? Number of Visits 17   ? Date for PT Re-Evaluation 05/12/22   ? Authorization Type [employer]   ? Authorization Time Period Pending Auth   ? PT Start Time 1615   ? PT Stop Time 1700   ? PT Time Calculation (min) 45 min   ? Activity Tolerance Patient tolerated treatment well   ? Behavior During Therapy [facility] for tasks assessed/performed   ? ?  ?  ? ?  ? ? ?Past Medical History:  ?Diagnosis Date  ? Chronic headaches   ? Depression with suicidal ideation   ? Eczema   ? Seborrheic keratosis of scalp   ? Sinusitis   ? Stress incontinence   ? ?Past Surgical History:  ?Procedure Laterality Date  ? NO PAST SURGERIES    ? ?Patient Active Problem List  ? Diagnosis Date Noted  ? Postpartum depression 02/08/2022  ? 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 ? ?PCP: Patient, No Pcp Per (Inactive) ? ?REFERRING PROVIDER: Persons, [place], [place] ? ?REFERRING DIAG: M54.50,G89.29 (ICD-10-CM) - Chronic bilateral low back pain, unspecified whether sciatica present ? ?THERAPY DIAG:  ?Chronic bilateral low back pain, unspecified whether sciatica present - Plan: PT plan of care cert/re-cert ? ?Muscle weakness (generalized) - Plan: PT plan of care cert/re-cert ? ?ONSET DATE: 04/2021 ? ?SUBJECTIVE:                                                                                                                                                                                          ? ?SUBJECTIVE STATEMENT: ?Pt reports primary c/o LBP of insidious onset lasting about 10 months. She adds that she experiences rare tingling in her Lt foot (only 3 instances of this). Otherwise, she denies any N/T related to this problem. She reports her pain is primarily  central, but can migrate to her BIL low back. She reports that she has a "muscle paind and then a nerve pain." She also recently gave birth to her daughter in December of last year. Aggravating factors include laying supine, bending over, prolonged standing >15 minutes, and lifting >15 pounds. Easing factors include massage, sitting. Current pain is 0/10. Worst pain is 10/10 when trying to get up from laying supine. Cancer and cauda equina red flag screening negative. ? ?PERTINENT HISTORY:  ?Depression, chronic headaches ? ?PAIN:  ?Are you  having pain? Yes: NPRS scale: 0/10 ?Pain location: BIL low back ?Pain description: achy in middle, sharp "nerve pain" in BIL low back ?Aggravating factors: laying supine, bending over, prolonged standing >15 minutes, and lifting >15 pounds ?Relieving factors: massage, sitting ? ? ?PRECAUTIONS: None ? ?WEIGHT BEARING RESTRICTIONS No ? ?FALLS:  ?Has patient fallen in last 6 months? No, Number of falls: 0 ? ?LIVING ENVIRONMENT: ?Lives with: lives with their family ?Lives in: House/apartment ?Stairs: No ?Has following equipment at home: None ? ?OCCUPATION: Unemployed ? ?PLOF: Independent ? ?PATIENT GOALS Return to household chores, exercising with less pain ? ?Screening for Suicide ? ?Answer the following questions with Yes or No and place an "x" beside the action taken. ? ?1. Over the past two weeks, have you felt down, depressed, or hopeless?   Yes ? ?2. Within the past two weeks, have you felt little interest or pleasure in life?  Yes ? ?If YES to either #1 or #2, then ask #3 ? ?3. Have you had thoughts that that life is not worth living or that you might be   ?    better off dead?   Yes ? ?If answer is NO and suspicion is low, then end ? ? ?4. Over this past week, have you had any thoughts about hurting or even killing yourself?  Yes ? ?If NO, then end. Patient in no immediate danger ? ? ?5. If so, do you believe that you intend to or will harm yourself?  No ? ?   If NO, then end.  Patient in no immediate danger ? ? ?6.  Do you have a plan as to how you would hurt yourself?   ? ? ?7.  Over this past week, have you actually done anything to hurt yourself?  ? ? ?IF YES answers to either #4, #5, #6 or #7, then patient is AT RISK for suicide ? ? ?Actions Taken ? ?____  Screening negative; no further action required ? ?__X__  Screening positive; no immediate danger and patient already in treatment with a ? mental health provider. Advise patient to speak to their mental health provider. ? ?____  Screening positive; no immediate danger. Patient advised to contact a mental ? health provider for further assessment.  ? ?____  Screening positive; in immediate danger as patient states intention of killing self, ? has plan and a sense of imminence. Do not leave alone. Seek permission from ? patient to contact a family member to inform them. Direct patient to go to ED.  ? ?OBJECTIVE:  ? ?DIAGNOSTIC FINDINGS:  ?02/28/2022: XR Lumbar Spine 2-3 Views: Radiographs of her lumbar spine demonstrate well-maintained and preserved  ?disc spaces.  She has no acute fractures.  Cannot visualize the SI joint  ?very well because of bowel pattern.  No degenerative changes no listhesis ? ?PATIENT SURVEYS:  ?Modified Oswestry 16/50, 32%  ? ?SCREENING FOR RED FLAGS: ?Bowel or bladder incontinence: No ?Cauda equina syndrome: No ? ? ?COGNITION: ? Overall cognitive status: Within functional limits for tasks assessed   ?  ?SENSATION: ?WFL ? ?MUSCLE LENGTH: ?Thomas test: Moderate complexity BIL ? ?POSTURE:  ?Mildly increased lumbar lordosis ? ?PALPATION: ?TTP to Lt lower thoracic/ upper lumbar paraspinals ? ?PASSIVE ACCESSORIES: ?Hypomobile and painful T8-L1 ? ?LUMBAR ROM:  ? ?Active  AROM  ?03/17/2022  ?Flexion WNL  ?Extension WNL  ?Right lateral flexion WNL  ?Left lateral flexion WNL  ?Right rotation WNL  ?Left rotation WNL  ? (Blank rows = not tested) ? ? ?  LE MMT: ? ?MMT Right ?03/17/2022 Left ?03/17/2022  ?Hip flexion 4+/5 4+/5   ?Hip extension 3+/5 3+/5  ?Hip abduction 4/5 4/5  ? (Blank rows = not tested) ? ?LUMBAR SPECIAL TESTS:  ?Sacral thrust: (+) ?Pelvic compression: (+) ?Pelvic distraction: (-) ?Hip thrust: (+) BIL ?Gaenslen's: (+) on Rt ?PLE: (-) ?SLR: (-) ?Active SLR: (-) ?Fortin's sign: (+) BIL ? ?FUNCTIONAL TESTS:  ?5xSTS: 8 seconds ?Squat: WNL ?Plank: 24 seconds ? ? ? ?TODAY'S TREATMENT  ?[facility] Adult PT Treatment:                                                DATE: 03/17/2022 ?Therapeutic Exercise: ?N/A ?Manual Therapy: ?Sidelying lumbar roll grade V manipulation x1 BIL with cavitation ?Prone thoracic grade V manipulation x4 throughout lumbar spine with cavitation ?Neuromuscular re-ed: ?N/A ?Therapeutic Activity: ?N/A ?Modalities: ?N/A ?Self Care: ?N/A ? ? ? ?PATIENT EDUCATION:  ?Education details: Pt educated on probable underlying pathophysiology behind her pain presentation, prognosis, POC, ODI, HEP ?Person educated: Patient ?Education method: Explanation, Demonstration, and Handouts ?Education comprehension: verbalized understanding and returned demonstration ? ? ?HOME EXERCISE PROGRAM: ? ?Access Code: [email or web address] ?URL: [email or web address] ?Date: 03/17/2022 ?Prepared by: Carmelina Dane ? ?Exercises ?- Plank on Knees  - 1 x daily - 7 x weekly - 3 sets - 30-60 seconds hold ?- Modified Side Plank with Hip Abduction  - 1 x daily - 7 x weekly - 3 sets - 10 reps ?- SIJ directional preference rocks  - 1 x daily - 7 x weekly - 3 sets - 10 reps ?- Hip Flexor Stretch at Edge of Bed  - 1 x daily - 7 x weekly - 2-min hold ? ? ?ASSESSMENT: ? ?CLINICAL IMPRESSION: ?Patient is a 21 y.o. F who was seen today for physical therapy evaluation and treatment for chronic low back pain. Upon assessment, her primary impairments include hypomobile and painful thoracic and upper lumbar passive accessories, tight BIL hip flexors, weak functional core strength, weak BIL global hip strength, TTP to Lt lower thoracic/ upper lumbar  paraspinals, and mildly increased lumbar lordosis. Ruling up SIJ dysfunction due to positive cluster of Laslett (4/5), recent hx of childbirth, and positive Fortin's sign, along with no reproduction of pain with

## 2022-03-17 ENCOUNTER — Other Ambulatory Visit: Payer: Self-pay

## 2022-03-17 ENCOUNTER — Ambulatory Visit: Payer: Medicaid Other | Attending: Physician Assistant

## 2022-03-17 DIAGNOSIS — M545 Low back pain, unspecified: Secondary | ICD-10-CM | POA: Diagnosis not present

## 2022-03-17 DIAGNOSIS — M6281 Muscle weakness (generalized): Secondary | ICD-10-CM | POA: Diagnosis present

## 2022-03-17 DIAGNOSIS — G8929 Other chronic pain: Secondary | ICD-10-CM | POA: Diagnosis present

## 2022-03-22 ENCOUNTER — Encounter: Payer: Medicaid Other | Attending: Physical Therapy | Admitting: Physical Therapy

## 2022-03-23 NOTE — Therapy (Signed)
?OUTPATIENT PHYSICAL THERAPY TREATMENT NOTE ? ? ?Patient Name: Gloria Orr ?MRN: [id number] ?DOB:03/10/2001, 21 y.o., female ?Today's Date: 03/24/2022 ? ?PCP: Patient, No Pcp Per (Inactive) ?REFERRING PROVIDER: [place], [place] ? ? PT End of Session - 03/24/22 1530   ? ? Visit Number 2   ? Number of Visits 17   ? Date for PT Re-Evaluation 05/12/22   ? Authorization Type [employer]   ? Authorization Time Period 03/18/2022-05/13/2022   ? Authorization - Visit Number 1   ? Authorization - Number of Visits 16   ? PT Start Time 1532   ? PT Stop Time 1615   ? PT Time Calculation (min) 43 min   ? Activity Tolerance Patient tolerated treatment well   ? Behavior During Therapy [facility] for tasks assessed/performed   ? ?  ?  ? ?  ? ? ?Past Medical History:  ?Diagnosis Date  ? Chronic headaches   ? Depression with suicidal ideation   ? Eczema   ? Seborrheic keratosis of scalp   ? Sinusitis   ? Stress incontinence   ? ?Past Surgical History:  ?Procedure Laterality Date  ? NO PAST SURGERIES    ? ?Patient Active Problem List  ? Diagnosis Date Noted  ? Postpartum depression 02/08/2022  ? 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 ? ?REFERRING DIAG: M54.50,G89.29 (ICD-10-CM) - Chronic bilateral low back pain, unspecified whether sciatica present ? ?THERAPY DIAG:  ?Chronic bilateral low back pain, unspecified whether sciatica present ? ?Muscle weakness (generalized) ? ?PERTINENT HISTORY: Depression, chronic headaches ? ?SUBJECTIVE: Pt denies any pain currently, adding that she has only been doing her planks from her HEP. ? ?PAIN:  ?Are you having pain? Yes: NPRS scale: 0/10 ?Pain location: BIL low back ?Pain description: achy in middle, sharp "nerve pain" in BIL low back ?Aggravating factors: laying supine, bending over, prolonged standing >15 minutes, and lifting >15 pounds ?Relieving factors: massage, sitting ? ? ? ? ?OBJECTIVE:  ? *Unless otherwise  noted, objective information collected previously* ? ?DIAGNOSTIC FINDINGS:  ?02/28/2022: XR Lumbar Spine 2-3 Views: Radiographs of her lumbar spine demonstrate well-maintained and preserved  ?disc spaces.  She has no acute fractures.  Cannot visualize the SI joint  ?very well because of bowel pattern.  No degenerative changes no listhesis ?  ?PATIENT SURVEYS:  ?Modified Oswestry 16/50, 32%  ?  ?SCREENING FOR RED FLAGS: ?Bowel or bladder incontinence: No ?Cauda equina syndrome: No ?  ?  ?COGNITION: ?          Overall cognitive status: Within functional limits for tasks assessed               ?           ?SENSATION: ?WFL ?  ?MUSCLE LENGTH: ?Thomas test: Moderate complexity BIL ?  ?POSTURE:  ?Mildly increased lumbar lordosis ?  ?PALPATION: ?TTP to Lt lower thoracic/ upper lumbar paraspinals ?  ?PASSIVE ACCESSORIES: ?Hypomobile and painful T8-L1 ?  ?LUMBAR ROM:  ?  ?Active  AROM  ?03/17/2022  ?Flexion WNL  ?Extension WNL  ?Right lateral flexion WNL  ?Left lateral flexion WNL  ?Right rotation WNL  ?Left rotation WNL  ? (Blank rows = not tested) ?  ?  ?LE MMT: ?  ?MMT Right ?03/17/2022 Left ?03/17/2022  ?Hip flexion 4+/5 4+/5  ?Hip extension 3+/5 3+/5  ?Hip abduction 4/5 4/5  ? (Blank rows = not tested) ?  ?LUMBAR SPECIAL TESTS:  ?  Sacral thrust: (+) ?Pelvic compression: (+) ?Pelvic distraction: (-) ?Hip thrust: (+) BIL ?Gaenslen's: (+) on Rt ?PLE: (-) ?SLR: (-) ?Active SLR: (-) ?Fortin's sign: (+) BIL ? ?2022-04-17: Piriformis test (+) BIL ?  ?FUNCTIONAL TESTS:  ?5xSTS: 8 seconds ?Squat: WNL ?Plank: 24 seconds ?  ?Apr 17, 2022: SIJ directional preference established in Lt innominate counter-nutation ? ?No leg-length discrepancy identified  ? ?  ?TODAY'S TREATMENT  ? ?[facility] Adult PT Treatment:                                                DATE: 2022/04/17 ?Therapeutic Exercise: ?Dead bugs 3x16 ?Hooklying trunk rotation 2x10 BIL ?Seated piriformis stretch x30sec BIL ?Standing abdominal press-down with 20# cable 1x8, 17# cable  2x10 ?Standing Pallof press with 10# cable 2x8 with 5-sec hold BIL ?Dead lift with 20# cable 2x10 ?Manual Therapy: ?Sidelying lumbar roll grade V manipulation x1 BIL with cavitations ?Effleurage and tapotement to BIL lumbar paraspinals/ QL ?Neuromuscular re-ed: ?N/A ?Therapeutic Activity: ?N/A ?Modalities: ?N/A ?Self Care: ?N/A ? ? ?[facility] Adult PT Treatment:                                                DATE: 03/17/2022 ?Therapeutic Exercise: ?N/A ?Manual Therapy: ?Sidelying lumbar roll grade V manipulation x1 BIL with cavitation ?Prone thoracic grade V manipulation x4 throughout lumbar spine with cavitation ?Neuromuscular re-ed: ?N/A ?Therapeutic Activity: ?N/A ?Modalities: ?N/A ?Self Care: ?N/A ?  ?  ?  ?PATIENT EDUCATION:  ?Education details: Pt educated on probable underlying pathophysiology behind her pain presentation, prognosis, POC, ODI, HEP ?Person educated: Patient ?Education method: Explanation, Demonstration, and Handouts ?Education comprehension: verbalized understanding and returned demonstration ?  ?  ?HOME EXERCISE PROGRAM: ?  ?Access Code: [email or web address] ?URL: [email or web address] ?Date: 03/17/2022 ?Prepared by: Carmelina Dane ?  ?Exercises ?- Plank on Knees  - 1 x daily - 7 x weekly - 3 sets - 30-60 seconds hold ?- Modified Side Plank with Hip Abduction  - 1 x daily - 7 x weekly - 3 sets - 10 reps ?- SIJ directional preference rocks  - 1 x daily - 7 x weekly - 3 sets - 10 reps ?- Hip Flexor Stretch at Edge of Bed  - 1 x daily - 7 x weekly - 2-min hold ?  ?  ?ASSESSMENT: ?  ?CLINICAL IMPRESSION: ?Pt responded well to all interventions today, demonstrating good form and minimal pain with exercises. Upon assessment of SIJ directional preference, a preference was discovered in Lt innominate counter-nutation; this was reflected in the pt's HEP. Additionally, the pt demonstrates positive piriformis testing BILL; a piriformis stretch was also added to the pt's HEP. The pt initially reports LBP  with lower trunk rotation stretches. Following lumbar spinal manipulation, the pt not only reported a therapeutic response and demonstrated improved lumbar passive accessory mobility, but she was also able to complete lumbar rotation stretches without pain following this intervention. She will continue to benefit from skilled PT to address her primary impairments and return to her prior level of function with less limitation. ?  ?  ?OBJECTIVE IMPAIRMENTS decreased ROM, decreased strength, hypomobility, impaired flexibility, improper body mechanics, postural dysfunction, and pain.  ?  ?ACTIVITY LIMITATIONS cleaning, community activity, laundry, yard work, and  shopping.  ?  ?PERSONAL FACTORS 1-2 comorbidities: Depression, chronic headaches  are also affecting patient's functional outcome.  ?  ?  ?REHAB POTENTIAL: Good ?  ?CLINICAL DECISION MAKING: Stable/uncomplicated ?  ?EVALUATION COMPLEXITY: Low ?  ?  ?GOALS: ?Goals reviewed with patient? Yes ?  ?SHORT TERM GOALS: Target date: 04/14/2022 ?  ?Pt will report understanding and adherence to her HEP in order to promote independence in the management of her primary impairments. ?Baseline: HEP provided at eval ?Goal status: INITIAL ?  ?  ?LONG TERM GOALS: Target date: 05/12/2022 ?  ?Pt will achieve an ODI score of 20% or lower in order to demonstrate improved functional ability as it relates to her low back pain. ?Baseline: 32% ?Goal status: INITIAL ?  ?2.  Pt will achieve BIL global hip strength to 5/5 in order to return to an independent LE strengthening regimen with less limitation. ?Baseline: See MMT chart ?Goal status: INITIAL ?  ?3.  Pt will report ability to stand >30 minutes with 0-2/10 pain in order to wash dishes without limitation. ?Baseline: Unable to stand > 15 minutes without >7/10 pain ?Goal status: INITIAL ?  ?4.  Pt will demonstrate ability to dead lift 30# with 0-2/10 pain in order to lift her child as she grows up without limitation. ?Baseline: Pt reports  >7/10 pain when lifting >15 pounds ?Goal status: INITIAL ?  ?  ?  ?PLAN: ?PT FREQUENCY: 2x/week ?  ?PT DURATION: 8 weeks ?  ?PLANNED INTERVENTIONS: Therapeutic exercises, Therapeutic activity, Neuromuscular re-

## 2022-03-24 ENCOUNTER — Ambulatory Visit: Payer: Medicaid Other

## 2022-03-24 ENCOUNTER — Ambulatory Visit: Payer: Medicaid Other | Admitting: Family Medicine

## 2022-03-24 VITALS — BP 110/67 | HR 79 | Wt 140.9 lb

## 2022-03-24 DIAGNOSIS — G8929 Other chronic pain: Secondary | ICD-10-CM

## 2022-03-24 DIAGNOSIS — Z30431 Encounter for routine checking of intrauterine contraceptive device: Secondary | ICD-10-CM

## 2022-03-24 DIAGNOSIS — M6281 Muscle weakness (generalized): Secondary | ICD-10-CM

## 2022-03-24 DIAGNOSIS — M545 Low back pain, unspecified: Secondary | ICD-10-CM | POA: Diagnosis not present

## 2022-03-24 NOTE — Progress Notes (Signed)
? ?  GYNECOLOGY OFFICE VISIT NOTE ? ?History:  ?21 y.o. G2P1011 here today for follow up from IUD placement. Reports she is doing well. Has had some light vaginal spotting. She denies any abnormal vaginal discharge, bleeding, pelvic pain or other concerns.  ? ?The following portions of the patient's history were reviewed and updated as appropriate: allergies, current medications, past family history, past medical history ? ?Review of Systems:  ?Pertinent items noted in HPI ?Review of Systems  ?Constitutional:  Negative for fever.  ?All other systems reviewed and are negative. ? ?Objective:  ?Physical Exam ?BP 110/67   Pulse 79   Wt 140 lb 14.4 oz (63.9 kg)   Breastfeeding Yes   BMI 25.77 kg/m?  ?Physical Exam ?Vitals and nursing note reviewed.  ?Cardiovascular:  ?   Rate and Rhythm: Normal rate.  ?   Pulses: Normal pulses.  ?Abdominal:  ?   General: Abdomen is flat.  ?Genitourinary: ?   General: Normal vulva.  ?   Comments: On speculum exam purple liletta string visualized along left vaginal wall extending to ~1cm inside introitus. String lightly grasped with ring forcep for ease of maneuvering and tripped to ~4-5 cm outside of os.  ?Neurological:  ?   Mental Status: She is alert.  ? ? ?Labs and Imaging ?No results found for this or any previous visit (from the past 168 hour(s)). ?XR Lumbar Spine 2-3 Views ? ?Result Date: 02/28/2022 ?Radiographs of her lumbar spine demonstrate well-maintained and preserved disc spaces.  She has no acute fractures.  Cannot visualize the SI joint very well because of bowel pattern.  No degenerative changes no listhesis  ? ?Assessment & Plan:  ?1. Encounter for routine checking of intrauterine contraceptive device (IUD) ?IUD placed on 2/28. Doing well. Strings visualized and trimmed. On speculum exam purple liletta string visualized along left vaginal wall extending to ~1cm inside introitus. String lightly grasped with ring forcep for ease of maneuvering and tripped to ~4-5 cm outside  of os. Discussed return precautions. Does report some spotting. Discussed allowing more time and if 3 months or so still unhappy with pattern to follow up to discuss further. ? ? ?Routine preventative health maintenance measures emphasized. ?Please refer to After Visit Summary for other counseling recommendations.  ? ? ?Total face-to-face time with patient: 20 minutes.  Over 50% of encounter was spent on counseling and coordination of care. ? ?Warner Mccreedy, MD, MPH ?OB Fellow, Faculty Practice ?Center for [employer], [facility] ? ?

## 2022-03-26 NOTE — Therapy (Signed)
?OUTPATIENT PHYSICAL THERAPY TREATMENT NOTE ? ? ?Patient Name: Gloria Orr ?MRN: [id number] ?DOB:[birth year], 21 y.o., female ?Today's Date: 03/28/2022 ? ?PCP: Patient, No Pcp Per (Inactive) ?REFERRING PROVIDER: Persons, [place], [place] ? ? PT End of Session - 03/28/22 1528   ? ? Visit Number 3   ? Number of Visits 17   ? Date for PT Re-Evaluation 05/12/22   ? Authorization Type [employer]   ? Authorization Time Period 03/18/2022-05/13/2022   ? Authorization - Visit Number 2   ? Authorization - Number of Visits 16   ? PT Start Time 1530   ? PT Stop Time 1615   ? PT Time Calculation (min) 45 min   ? Activity Tolerance Patient tolerated treatment well   ? Behavior During Therapy [facility] for tasks assessed/performed   ? ?  ?  ? ?  ? ? ? ?Past Medical History:  ?Diagnosis Date  ? Chronic headaches   ? Depression with suicidal ideation   ? Eczema   ? Seborrheic keratosis of scalp   ? Sinusitis   ? Stress incontinence   ? ?Past Surgical History:  ?Procedure Laterality Date  ? NO PAST SURGERIES    ? ?Patient Active Problem List  ? Diagnosis Date Noted  ? Postpartum depression 02/08/2022  ? 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 ? ?REFERRING DIAG: M54.50,G89.29 (ICD-10-CM) - Chronic bilateral low back pain, unspecified whether sciatica present ? ?THERAPY DIAG:  ?Chronic bilateral low back pain, unspecified whether sciatica present ? ?Muscle weakness (generalized) ? ?PERTINENT HISTORY: Depression, chronic headaches ? ?SUBJECTIVE: Pt reports 1-2/10 LBP, adding that it has been a little achy, but not bad. She reports not doing her HEP due to the achy pain. ? ?PAIN:  ?Are you having pain? Yes: NPRS scale: 1-2/10 ?Pain location: BIL low back ?Pain description: achy in middle, sharp "nerve pain" in BIL low back ?Aggravating factors: laying supine, bending over, prolonged standing >15 minutes, and lifting >15 pounds ?Relieving factors: massage,  sitting ? ? ? ? ?OBJECTIVE:  ? *Unless otherwise noted, objective information collected previously* ? ?DIAGNOSTIC FINDINGS:  ?02/28/2022: XR Lumbar Spine 2-3 Views: Radiographs of her lumbar spine demonstrate well-maintained and preserved  ?disc spaces.  She has no acute fractures.  Cannot visualize the SI joint  ?very well because of bowel pattern.  No degenerative changes no listhesis ?  ?PATIENT SURVEYS:  ?Modified Oswestry 16/50, 32%  ?  ?SCREENING FOR RED FLAGS: ?Bowel or bladder incontinence: No ?Cauda equina syndrome: No ?  ?  ?COGNITION: ?          Overall cognitive status: Within functional limits for tasks assessed               ?           ?SENSATION: ?WFL ?  ?MUSCLE LENGTH: ?Thomas test: Moderate complexity BIL ?  ?POSTURE:  ?Mildly increased lumbar lordosis ?  ?PALPATION: ?TTP to Lt lower thoracic/ upper lumbar paraspinals ?  ?PASSIVE ACCESSORIES: ?Hypomobile and painful T8-L1 ?  ?LUMBAR ROM:  ?  ?Active  AROM  ?03/17/2022  ?Flexion WNL  ?Extension WNL  ?Right lateral flexion WNL  ?Left lateral flexion WNL  ?Right rotation WNL  ?Left rotation WNL  ? (Blank rows = not tested) ?  ?  ?LE MMT: ?  ?MMT Right ?03/17/2022 Left ?03/17/2022  ?Hip flexion 4+/5 4+/5  ?Hip extension 3+/5 3+/5  ?Hip abduction 4/5 4/5  ? (  Blank rows = not tested) ?  ?LUMBAR SPECIAL TESTS:  ?Sacral thrust: (+) ?Pelvic compression: (+) ?Pelvic distraction: (-) ?Hip thrust: (+) BIL ?Gaenslen's: (+) on Rt ?PLE: (-) ?SLR: (-) ?Active SLR: (-) ?Fortin's sign: (+) BIL ? ?15-Apr-2022: Piriformis test (+) BIL ?  ?FUNCTIONAL TESTS:  ?5xSTS: 8 seconds ?Squat: WNL ?Plank: 24 seconds ?  ?2022/04/15: SIJ directional preference established in Lt innominate counter-nutation ? ?No leg-length discrepancy identified  ? ?  ?TODAY'S TREATMENT  ? ?[facility] Adult PT Treatment:                                                DATE: 03/28/2022 ?Therapeutic Exercise: ?Bicycle crunches 3x20 ?Prone superman 3x10 with 3-sec hold ?Prone press-up 2x30sec ?15# kettlebell swings  3x15 ?Mini-squat side steps with 10# cable x4 BIL ?Down shops with chop bar and 10# cable 2x10 BIL ?Standing golfer's stretch x10 BIL ?Tall-kneeling hip extension with knees on Airex pad and 23# cable 1x10, 17# cable 1x10 ?Manual Therapy: ?Sidelying lumbar roll grade V manipulation x1 BIL with cavitation ?Prone thoracic grade V manipulation x4 throughout lumbar spine with cavitation ?Neuromuscular re-ed: ?N/A ?Therapeutic Activity: ?N/A ?Modalities: ?N/A ?Self Care: ?N/A ? ? ?[facility] Adult PT Treatment:                                                DATE: 2022/04/15 ?Therapeutic Exercise: ?Dead bugs 3x16 ?Hooklying trunk rotation 2x10 BIL ?Seated piriformis stretch x30sec BIL ?Standing abdominal press-down with 20# cable 1x8, 17# cable 2x10 ?Standing Pallof press with 10# cable 2x8 with 5-sec hold BIL ?Dead lift with 20# cable 2x10 ?Manual Therapy: ?Sidelying lumbar roll grade V manipulation x1 BIL with cavitations ?Effleurage and tapotement to BIL lumbar paraspinals/ QL ?Neuromuscular re-ed: ?N/A ?Therapeutic Activity: ?N/A ?Modalities: ?N/A ?Self Care: ?N/A ? ? ?[facility] Adult PT Treatment:                                                DATE: 03/17/2022 ?Therapeutic Exercise: ?N/A ?Manual Therapy: ?Sidelying lumbar roll grade V manipulation x1 BIL with cavitation ?Prone thoracic grade V manipulation x4 throughout lumbar spine with cavitation ?Neuromuscular re-ed: ?N/A ?Therapeutic Activity: ?N/A ?Modalities: ?N/A ?Self Care: ?N/A ?  ?  ?  ?PATIENT EDUCATION:  ?Education details: Pt educated on probable underlying pathophysiology behind her pain presentation, prognosis, POC, ODI, HEP ?Person educated: Patient ?Education method: Explanation, Demonstration, and Handouts ?Education comprehension: verbalized understanding and returned demonstration ?  ?  ?HOME EXERCISE PROGRAM: ?  ?Access Code: [email or web address] ?URL: [email or web address] ?Date: 03/17/2022 ?Prepared by: Carmelina Dane ?  ?Exercises ?- Plank on Knees  -  1 x daily - 7 x weekly - 3 sets - 30-60 seconds hold ?- Modified Side Plank with Hip Abduction  - 1 x daily - 7 x weekly - 3 sets - 10 reps ?- SIJ directional preference rocks  - 1 x daily - 7 x weekly - 3 sets - 10 reps ?- Hip Flexor Stretch at Edge of Bed  - 1 x daily - 7 x weekly - 2-min hold ?  ?  ?ASSESSMENT: ?  ?  CLINICAL IMPRESSION: ?Pt responded well to all interventions today, demonstrating good form and no pain with completed exercises. She again responded excellently to spinal manipulation today, reporting improved pain and demonstrating improved thoracolumbar passive accessory mobility following several cavitations. She will continue to benefit from skilled PT to address her primary impairments and return to her prior level of function with less limitation. ?  ?  ?OBJECTIVE IMPAIRMENTS decreased ROM, decreased strength, hypomobility, impaired flexibility, improper body mechanics, postural dysfunction, and pain.  ?  ?ACTIVITY LIMITATIONS cleaning, community activity, laundry, yard work, and shopping.  ?  ?PERSONAL FACTORS 1-2 comorbidities: Depression, chronic headaches  are also affecting patient's functional outcome.  ?  ?  ?REHAB POTENTIAL: Good ?  ?CLINICAL DECISION MAKING: Stable/uncomplicated ?  ?EVALUATION COMPLEXITY: Low ?  ?  ?GOALS: ?Goals reviewed with patient? Yes ?  ?SHORT TERM GOALS: Target date: 04/14/2022 ?  ?Pt will report understanding and adherence to her HEP in order to promote independence in the management of her primary impairments. ?Baseline: HEP provided at eval ?Goal status: INITIAL ?  ?  ?LONG TERM GOALS: Target date: 05/12/2022 ?  ?Pt will achieve an ODI score of 20% or lower in order to demonstrate improved functional ability as it relates to her low back pain. ?Baseline: 32% ?Goal status: INITIAL ?  ?2.  Pt will achieve BIL global hip strength to 5/5 in order to return to an independent LE strengthening regimen with less limitation. ?Baseline: See MMT chart ?Goal status:  INITIAL ?  ?3.  Pt will report ability to stand >30 minutes with 0-2/10 pain in order to wash dishes without limitation. ?Baseline: Unable to stand > 15 minutes without >7/10 pain ?Goal status: INITIAL ?  ?4.  Pt will demo

## 2022-03-28 ENCOUNTER — Ambulatory Visit: Payer: Medicaid Other | Attending: Physician Assistant

## 2022-03-28 DIAGNOSIS — M545 Low back pain, unspecified: Secondary | ICD-10-CM | POA: Diagnosis present

## 2022-03-28 DIAGNOSIS — M6281 Muscle weakness (generalized): Secondary | ICD-10-CM | POA: Diagnosis present

## 2022-03-28 DIAGNOSIS — G8929 Other chronic pain: Secondary | ICD-10-CM | POA: Diagnosis present

## 2022-03-30 ENCOUNTER — Ambulatory Visit: Payer: Medicaid Other | Admitting: Physical Therapy

## 2022-03-30 ENCOUNTER — Encounter: Payer: Self-pay | Admitting: Physical Therapy

## 2022-03-30 DIAGNOSIS — M545 Low back pain, unspecified: Secondary | ICD-10-CM

## 2022-03-30 DIAGNOSIS — M6281 Muscle weakness (generalized): Secondary | ICD-10-CM

## 2022-03-30 NOTE — Therapy (Addendum)
?PHYSICAL THERAPY UNPLANNED DISCHARGE SUMMARY  ? ?Visits from Start of Care: 4 ? ?Current functional level related to goals / functional outcomes: ?Current status unknown ?  ?Remaining deficits: ?Current status unknown ?  ?Education / Equipment: ?Pt has not returned since visit listed below ? ?Patient goals were not assessed. Patient is being discharged due to not returning since the last visit. ? ?(the below note was addended to include the above D/C summary on 05/10/22) ? ?OUTPATIENT PHYSICAL THERAPY TREATMENT NOTE ? ? ?Patient Name: Gloria Orr ?MRN: [id number] ?DOB:05-Oct-2001, 21 y.o., female ?Today's Date: 03/30/2022 ? ?PCP: Patient, No Pcp Per (Inactive) ?REFERRING PROVIDER: Persons, [place], [place] ? ? PT End of Session - 03/30/22 1525   ? ? Visit Number 4   ? Number of Visits 17   ? Date for PT Re-Evaluation 05/12/22   ? Authorization Type [employer]   ? Authorization Time Period 03/18/2022-05/13/2022   ? Authorization - Visit Number 4   ? Authorization - Number of Visits 16   ? PT Start Time 0330   ? PT Stop Time [phone]   ? PT Time Calculation (min) 42 min   ? Activity Tolerance Patient tolerated treatment well   ? Behavior During Therapy [facility] for tasks assessed/performed   ? ?  ?  ? ?  ? ? ? ?Past Medical History:  ?Diagnosis Date  ? Chronic headaches   ? Depression with suicidal ideation   ? Eczema   ? Seborrheic keratosis of scalp   ? Sinusitis   ? Stress incontinence   ? ?Past Surgical History:  ?Procedure Laterality Date  ? NO PAST SURGERIES    ? ?Patient Active Problem List  ? Diagnosis Date Noted  ? Postpartum depression 02/08/2022  ? 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 ? ?REFERRING DIAG: M54.50,G89.29 (ICD-10-CM) - Chronic bilateral low back pain, unspecified whether sciatica present ? ?THERAPY DIAG:  ?Chronic bilateral low back pain, unspecified whether sciatica present ? ?Muscle weakness (generalized) ? ?PERTINENT  HISTORY: Depression, chronic headaches ? ?SUBJECTIVE:  ? ?Pt reports that she has been doing well overall.  She reports some soreness after last visit. ? ?PAIN:  ?Are you having pain? Yes: NPRS scale: 1-2/10 ?Pain location: BIL low back ?Pain description: achy in middle, sharp "nerve pain" in BIL low back ?Aggravating factors: laying supine, bending over, prolonged standing >15 minutes, and lifting >15 pounds ?Relieving factors: massage, sitting ? ? ? ? ?OBJECTIVE:  ? *Unless otherwise noted, objective information collected previously* ? ?DIAGNOSTIC FINDINGS:  ?02/28/2022: XR Lumbar Spine 2-3 Views: Radiographs of her lumbar spine demonstrate well-maintained and preserved  ?disc spaces.  She has no acute fractures.  Cannot visualize the SI joint  ?very well because of bowel pattern.  No degenerative changes no listhesis ?  ?PATIENT SURVEYS:  ?Modified Oswestry 16/50, 32%  ?  ?SCREENING FOR RED FLAGS: ?Bowel or bladder incontinence: No ?Cauda equina syndrome: No ?  ?  ?COGNITION: ?          Overall cognitive status: Within functional limits for tasks assessed               ?           ?SENSATION: ?WFL ?  ?MUSCLE LENGTH: ?Thomas test: Moderate complexity BIL ?  ?POSTURE:  ?Mildly increased lumbar lordosis ?  ?PALPATION: ?TTP to Lt lower thoracic/ upper lumbar paraspinals ?  ?PASSIVE ACCESSORIES: ?Hypomobile and painful T8-L1 ?  ?  LUMBAR ROM:  ?  ?Active  AROM  ?03/17/2022  ?Flexion WNL  ?Extension WNL  ?Right lateral flexion WNL  ?Left lateral flexion WNL  ?Right rotation WNL  ?Left rotation WNL  ? (Blank rows = not tested) ?  ?  ?LE MMT: ?  ?MMT Right ?03/17/2022 Left ?03/17/2022  ?Hip flexion 4+/5 4+/5  ?Hip extension 3+/5 3+/5  ?Hip abduction 4/5 4/5  ? (Blank rows = not tested) ?  ?LUMBAR SPECIAL TESTS:  ?Sacral thrust: (+) ?Pelvic compression: (+) ?Pelvic distraction: (-) ?Hip thrust: (+) BIL ?Gaenslen's: (+) on Rt ?PLE: (-) ?SLR: (-) ?Active SLR: (-) ?Fortin's sign: (+) BIL ? ?03/24/2022: Piriformis test (+) BIL ?   ?FUNCTIONAL TESTS:  ?5xSTS: 8 seconds ?Squat: WNL ?Plank: 24 seconds ?  ?03/24/2022: SIJ directional preference established in Lt innominate counter-nutation ? ?No leg-length discrepancy identified  ? ?  ?TODAY'S TREATMENT  ? ?[facility]:                                                DATE: 03/30/2022 ?Therapeutic Exercise: ?Bicycle crunches 3x20 ?Dead bug with ball - 3x10 ?Elliptical - 4' ?Side plank - 30'' 2x ea ?Prone superman 3x10 with 5-sec hold ?Prone press-up 2x30sec ?15# kettlebell swings 3x15 ?Down and upward chops with chop bar and (10# down, 3# up cable 2x10 BIL) ?Lumbar ext row - 3x10 - 40# ?Golfers stretch 10x ? ?[facility]                                                DATE: 03/28/2022 ?Therapeutic Exercise: ?Bicycle crunches 3x20 ?Prone superman 3x10 with 3-sec hold ?Prone press-up 2x30sec ?15# kettlebell swings 3x15 ?Mini-squat side steps with 10# cable x4 BIL ?Down shops with chop bar and 10# cable 2x10 BIL ?Standing golfer's stretch x10 BIL ?Tall-kneeling hip extension with knees on Airex pad and 23# cable 1x10, 17# cable 1x10 ?Manual Therapy: ?Sidelying lumbar roll grade V manipulation x1 BIL with cavitation ?Prone thoracic grade V manipulation x4 throughout lumbar spine with cavitation ?Neuromuscular re-ed: ?N/A ?Therapeutic Activity: ?N/A ?Modalities: ?N/A ?Self Care: ?N/A ? ? ?[facility] Adult PT Treatment:                                                DATE: 03/24/2022 ?Therapeutic Exercise: ?Dead bugs 3x16 ?Hooklying trunk rotation 2x10 BIL ?Seated piriformis stretch x30sec BIL ?Standing abdominal press-down with 20# cable 1x8, 17# cable 2x10 ?Standing Pallof press with 10# cable 2x8 with 5-sec hold BIL ?Dead lift with 20# cable 2x10 ?Manual Therapy: ?Sidelying lumbar roll grade V manipulation x1 BIL with cavitations ?Effleurage and tapotement to BIL lumbar paraspinals/ QL ?Neuromuscular re-ed: ?N/A ?Therapeutic Activity: ?N/A ?Modalities: ?N/A ?Self Care: ?N/A ? ? ?[facility] Adult  PT Treatment:                                                DATE: 03/17/2022 ?Therapeutic Exercise: ?N/A ?Manual Therapy: ?Sidelying lumbar  roll grade V manipulation x1 BIL with cavitation ?Prone thoracic grade V manipulation x4 throughout lumbar spine with cavitation ?Neuromuscular re-ed: ?N/A ?Therapeutic Activity: ?N/A ?Modalities: ?N/A ?Self Care: ?N/A ?  ?  ?  ?PATIENT EDUCATION:  ?Education details: Pt educated on probable underlying pathophysiology behind her pain presentation, prognosis, POC, ODI, HEP ?Person educated: Patient ?Education method: Explanation, Demonstration, and Handouts ?Education comprehension: verbalized understanding and returned demonstration ?  ?  ?HOME EXERCISE PROGRAM: ?  ?Access Code: [email or web address] ?URL: [email or web address] ?Date: 03/17/2022 ?Prepared by: Carmelina Dane ?  ?Exercises ?- Plank on Knees  - 1 x daily - 7 x weekly - 3 sets - 30-60 seconds hold ?- Modified Side Plank with Hip Abduction  - 1 x daily - 7 x weekly - 3 sets - 10 reps ?- SIJ directional preference rocks  - 1 x daily - 7 x weekly - 3 sets - 10 reps ?- Hip Flexor Stretch at Edge of Bed  - 1 x daily - 7 x weekly - 2-min hold ?  ?  ?ASSESSMENT: ?  ?CLINICAL IMPRESSION: ?Charlyne is progressing as expected.  She is able to complete all exercises with good form.  She does have fatigue but no increase in pain.  Reduction in pain to 0/10 following therapy. ?  ?  ?OBJECTIVE IMPAIRMENTS decreased ROM, decreased strength, hypomobility, impaired flexibility, improper body mechanics, postural dysfunction, and pain.  ?  ?ACTIVITY LIMITATIONS cleaning, community activity, laundry, yard work, and shopping.  ?  ?PERSONAL FACTORS 1-2 comorbidities: Depression, chronic headaches  are also affecting patient's functional outcome.  ?  ?  ?REHAB POTENTIAL: Good ?  ?CLINICAL DECISION MAKING: Stable/uncomplicated ?  ?EVALUATION COMPLEXITY: Low ?  ?  ?GOALS: ?Goals reviewed with patient? Yes ?  ?SHORT TERM GOALS: Target date:  04/14/2022 ?  ?Pt will report understanding and adherence to her HEP in order to promote independence in the management of her primary impairments. ?Baseline: HEP provided at eval ?Goal status: INITIAL ?  ?  ?

## 2022-04-05 ENCOUNTER — Encounter: Payer: Self-pay | Admitting: Orthopaedic Surgery

## 2022-04-05 ENCOUNTER — Ambulatory Visit (INDEPENDENT_AMBULATORY_CARE_PROVIDER_SITE_OTHER): Payer: Medicaid Other | Admitting: Orthopaedic Surgery

## 2022-04-05 DIAGNOSIS — G8929 Other chronic pain: Secondary | ICD-10-CM

## 2022-04-05 DIAGNOSIS — M545 Low back pain, unspecified: Secondary | ICD-10-CM | POA: Insufficient documentation

## 2022-04-05 NOTE — Progress Notes (Addendum)
Office Visit Note   Patient: Gloria Orr           Date of Birth: November 18, 2001           MRN: [id number] Visit Date: 04/05/2022              Requested by: No referring provider defined for this encounter. PCP: Patient, No Pcp Per (Inactive)   Assessment & Plan: Visit Diagnoses:  1. Chronic bilateral low back pain, unspecified whether sciatica present   2. Chronic bilateral low back pain without sciatica     Plan: Onset of low back pain approximately 5 months into her pregnancy.  Delivered in December 2022 but continues to have back discomfort to the point of compromise.  She has pain in the upper lumbar spine when she bends stoops or stands for any length of time.  At night she will have more pain in the lower lumbar spine trying to find a comfortable position.  She denies any bowel or bladder changes.  She has not had any numbness in either lower extremity but has had some tingling in her left foot.  She did not have any problem with her back prior to her pregnancy.  She did have a pregnancy in 2021 that ended in a miscarriage and she had some very mild back pain during that time but resolved after the miscarriage.  I have reviewed her x-rays and did not see any abnormality and still feel like there is probably muscular skeletal in origin.  There is no pain over the SI joints.  Exam is benign.  We will order an MRI scan and have her continue with the physical therapy.  She is not sure that she is any better since her delivery 4 months ago. Has completed a 4-week course of physical therapy and still having discomfort  Follow-Up Instructions: Return After MRI scan lumbar spine.   Orders:  Orders Placed This Encounter  Procedures   MR Lumbar Spine w/o contrast   No orders of the defined types were placed in this encounter.     Procedures: No procedures performed   Clinical Data: No additional findings.   Subjective: Chief Complaint  Patient presents with   Lower Back -  Follow-up  Patient presents today for a follow up on her lower back. She saw [place] a month ago and was referred to physical therapy. She has been going to physical therapy twice weekly. She said that her pain is no better. She has tingling in her left foot. She does not take anything for pain.   HPI  Review of Systems   Objective: Vital Signs: There were no vitals taken for this visit.  Physical Exam Constitutional:      Appearance: She is well-developed.  Eyes:     Pupils: Pupils are equal, round, and reactive to light.  Pulmonary:     Effort: Pulmonary effort is normal.  Skin:    General: Skin is warm and dry.  Neurological:     Mental Status: She is alert and oriented to person, place, and time.  Psychiatric:        Behavior: Behavior normal.    Ortho Exam awake and alert and comfortable.  Comfortable sitting and standing without a limp.  Straight leg raise is negative.  Reflexes were symmetrical.  Motor and sensory exam intact.  Painless range of motion of both hips.  No pain over either SI joint.  Had some diffuse mild percussible tenderness along the lumbar  spine.  No flank discomfort.   Specialty Comments:  No specialty comments available.  Imaging: No results found.   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   Past Medical History:  Diagnosis Date   Chronic headaches    Depression with suicidal ideation    Eczema    Seborrheic keratosis of scalp    Sinusitis    Stress incontinence     Family History  Problem Relation Age of Onset   Asthma Mother    Hypertension Father    Diabetes Father    Heart disease Sister    Asthma Sister    Asthma Brother     Past Surgical History:  Procedure Laterality Date   NO PAST SURGERIES     Social History    Occupational History   Occupation: Ecologist    Comment: [employer]  Tobacco Use   Smoking status: Never   Smokeless tobacco: Never  Vaping Use   Vaping Use: Never used  Substance and Sexual Activity   Alcohol use: Not Currently   Drug use: Never   Sexual activity: Yes

## 2022-04-06 ENCOUNTER — Ambulatory Visit: Payer: Medicaid Other

## 2022-04-06 NOTE — Therapy (Incomplete)
?OUTPATIENT PHYSICAL THERAPY TREATMENT NOTE ? ? ?Patient Name: Gloria Orr ?MRN: [id number] ?DOB:06/25/2001, 21 y.o., female ?Today's Date: 04/06/2022 ? ?PCP: Patient, No Pcp Per (Inactive) ?REFERRING PROVIDER: [place], [place] ? ? ? ? ? ?Past Medical History:  ?Diagnosis Date  ? Chronic headaches   ? Depression with suicidal ideation   ? Eczema   ? Seborrheic keratosis of scalp   ? Sinusitis   ? Stress incontinence   ? ?Past Surgical History:  ?Procedure Laterality Date  ? NO PAST SURGERIES    ? ?Patient Active Problem List  ? Diagnosis Date Noted  ? Low back pain 04/05/2022  ? Postpartum depression 02/08/2022  ? 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 ? ?REFERRING DIAG: M54.50,G89.29 (ICD-10-CM) - Chronic bilateral low back pain, unspecified whether sciatica present ? ?THERAPY DIAG:  ?No diagnosis found. ? ?PERTINENT HISTORY: Depression, chronic headaches ? ?SUBJECTIVE:  ? ?*** ? ?PAIN:  ?Are you having pain? Yes: NPRS scale: 1-2/10 ?Pain location: BIL low back ?Pain description: achy in middle, sharp "nerve pain" in BIL low back ?Aggravating factors: laying supine, bending over, prolonged standing >15 minutes, and lifting >15 pounds ?Relieving factors: massage, sitting ? ? ? ? ?OBJECTIVE:  ? *Unless otherwise noted, objective information collected previously* ? ?DIAGNOSTIC FINDINGS:  ?02/28/2022: XR Lumbar Spine 2-3 Views: Radiographs of her lumbar spine demonstrate well-maintained and preserved  ?disc spaces.  She has no acute fractures.  Cannot visualize the SI joint  ?very well because of bowel pattern.  No degenerative changes no listhesis ?  ?PATIENT SURVEYS:  ?Modified Oswestry 16/50, 32%  ?  ?SCREENING FOR RED FLAGS: ?Bowel or bladder incontinence: No ?Cauda equina syndrome: No ?  ?  ?COGNITION: ?          Overall cognitive status: Within functional limits for tasks assessed               ?           ?SENSATION: ?WFL ?  ?MUSCLE  LENGTH: ?Thomas test: Moderate complexity BIL ?  ?POSTURE:  ?Mildly increased lumbar lordosis ?  ?PALPATION: ?TTP to Lt lower thoracic/ upper lumbar paraspinals ?  ?PASSIVE ACCESSORIES: ?Hypomobile and painful T8-L1 ?  ?LUMBAR ROM:  ?  ?Active  AROM  ?03/17/2022  ?Flexion WNL  ?Extension WNL  ?Right lateral flexion WNL  ?Left lateral flexion WNL  ?Right rotation WNL  ?Left rotation WNL  ? (Blank rows = not tested) ?  ?  ?LE MMT: ?  ?MMT Right ?03/17/2022 Left ?03/17/2022  ?Hip flexion 4+/5 4+/5  ?Hip extension 3+/5 3+/5  ?Hip abduction 4/5 4/5  ? (Blank rows = not tested) ?  ?LUMBAR SPECIAL TESTS:  ?Sacral thrust: (+) ?Pelvic compression: (+) ?Pelvic distraction: (-) ?Hip thrust: (+) BIL ?Gaenslen's: (+) on Rt ?PLE: (-) ?SLR: (-) ?Active SLR: (-) ?Fortin's sign: (+) BIL ? ?03/24/2022: Piriformis test (+) BIL ?  ?FUNCTIONAL TESTS:  ?5xSTS: 8 seconds ?Squat: WNL ?Plank: 24 seconds ?  ?03/24/2022: SIJ directional preference established in Lt innominate counter-nutation ? ?No leg-length discrepancy identified  ? ?  ?TODAY'S TREATMENT  ? ?[facility]                                                DATE: 04/06/2022 ?Therapeutic Exercise: ?*** ?Manual Therapy: ?*** ?  Neuromuscular re-ed: ?*** ?Therapeutic Activity: ?*** ?Modalities: ?*** ?Self Care: ?*** ? ? ?[facility] Adult PT Treatment:                                                DATE: 03/30/2022 ?Therapeutic Exercise: ?Bicycle crunches 3x20 ?Dead bug with ball - 3x10 ?Elliptical - 4' ?Side plank - 30'' 2x ea ?Prone superman 3x10 with 5-sec hold ?Prone press-up 2x30sec ?15# kettlebell swings 3x15 ?Down and upward chops with chop bar and (10# down, 3# up cable 2x10 BIL) ?Lumbar ext row - 3x10 - 40# ?Golfers stretch 10x ? ?[facility] Adult PT Treatment:                                                DATE: 03/28/2022 ?Therapeutic Exercise: ?Bicycle crunches 3x20 ?Prone superman 3x10 with 3-sec hold ?Prone press-up 2x30sec ?15# kettlebell swings 3x15 ?Mini-squat side steps with 10#  cable x4 BIL ?Down shops with chop bar and 10# cable 2x10 BIL ?Standing golfer's stretch x10 BIL ?Tall-kneeling hip extension with knees on Airex pad and 23# cable 1x10, 17# cable 1x10 ?Manual Therapy: ?Sidelying lumbar roll grade V manipulation x1 BIL with cavitation ?Prone thoracic grade V manipulation x4 throughout lumbar spine with cavitation ?Neuromuscular re-ed: ?N/A ?Therapeutic Activity: ?N/A ?Modalities: ?N/A ?Self Care: ?N/A ? ?  ?  ?  ?PATIENT EDUCATION:  ?Education details: Pt educated on probable underlying pathophysiology behind her pain presentation, prognosis, POC, ODI, HEP ?Person educated: Patient ?Education method: Explanation, Demonstration, and Handouts ?Education comprehension: verbalized understanding and returned demonstration ?  ?  ?HOME EXERCISE PROGRAM: ?  ?Access Code: [email or web address] ?URL: [email or web address] ?Date: 03/17/2022 ?Prepared by: Carmelina Dane ?  ?Exercises ?- Plank on Knees  - 1 x daily - 7 x weekly - 3 sets - 30-60 seconds hold ?- Modified Side Plank with Hip Abduction  - 1 x daily - 7 x weekly - 3 sets - 10 reps ?- SIJ directional preference rocks  - 1 x daily - 7 x weekly - 3 sets - 10 reps ?- Hip Flexor Stretch at Edge of Bed  - 1 x daily - 7 x weekly - 2-min hold ?  ?  ?ASSESSMENT: ?  ?CLINICAL IMPRESSION: ?*** ?  ?  ?OBJECTIVE IMPAIRMENTS decreased ROM, decreased strength, hypomobility, impaired flexibility, improper body mechanics, postural dysfunction, and pain.  ?  ?ACTIVITY LIMITATIONS cleaning, community activity, laundry, yard work, and shopping.  ?  ?PERSONAL FACTORS 1-2 comorbidities: Depression, chronic headaches  are also affecting patient's functional outcome.  ?  ?  ?REHAB POTENTIAL: Good ?  ?CLINICAL DECISION MAKING: Stable/uncomplicated ?  ?EVALUATION COMPLEXITY: Low ?  ?  ?GOALS: ?Goals reviewed with patient? Yes ?  ?SHORT TERM GOALS: Target date: 04/14/2022 ?  ?Pt will report understanding and adherence to her HEP in order to promote  independence in the management of her primary impairments. ?Baseline: HEP provided at eval ?Goal status: INITIAL ?  ?  ?LONG TERM GOALS: Target date: 05/12/2022 ?  ?Pt will achieve an ODI score of 20% or lower in order to demonstrate improved functional ability as it relates to her low back pain. ?Baseline: 32% ?Goal status: INITIAL ?  ?2.  Pt will achieve BIL global hip strength to 5/5  in order to return to an independent LE strengthening regimen with less limitation. ?Baseline: See MMT chart ?Goal status: INITIAL ?  ?3.  Pt will report ability to stand >30 minutes with 0-2/10 pain in order to wash dishes without limitation. ?Baseline: Unable to stand > 15 minutes without >7/10 pain ?Goal status: INITIAL ?  ?4.  Pt will demonstrate ability to dead lift 30# with 0-2/10 pain in order to lift her child as she grows up without limitation. ?Baseline: Pt reports >7/10 pain when lifting >15 pounds ?Goal status: INITIAL ?  ?  ?  ?PLAN: ?PT FREQUENCY: 2x/week ?  ?PT DURATION: 8 weeks ?  ?PLANNED INTERVENTIONS: Therapeutic exercises, Therapeutic activity, Neuromuscular re-education, Patient/Family education, Joint manipulation, Joint mobilization, Dry Needling, Electrical stimulation, Spinal manipulation, Spinal mobilization, Cryotherapy, Moist heat, Taping, and Manual therapy. ?  ?PLAN FOR NEXT SESSION: Progress core/ hip strength ? ? ?Zadie Rhine PT ?04/06/22 9:26 AM ? ? ?  ? ?

## 2022-04-07 NOTE — Therapy (Incomplete)
?OUTPATIENT PHYSICAL THERAPY TREATMENT NOTE ? ? ?Patient Name: Gloria Orr ?MRN: [id number] ?DOB:[birth year], 21 y.o., female ?Today's Date: 04/07/2022 ? ?PCP: Patient, No Pcp Per (Inactive) ?REFERRING PROVIDER: Persons, [place], [place] ? ? ? ? ? ?Past Medical History:  ?Diagnosis Date  ? Chronic headaches   ? Depression with suicidal ideation   ? Eczema   ? Seborrheic keratosis of scalp   ? Sinusitis   ? Stress incontinence   ? ?Past Surgical History:  ?Procedure Laterality Date  ? NO PAST SURGERIES    ? ?Patient Active Problem List  ? Diagnosis Date Noted  ? Low back pain 04/05/2022  ? Postpartum depression 02/08/2022  ? 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 ? ?REFERRING DIAG: M54.50,G89.29 (ICD-10-CM) - Chronic bilateral low back pain, unspecified whether sciatica present ? ?THERAPY DIAG:  ?No diagnosis found. ? ?PERTINENT HISTORY: Depression, chronic headaches ? ?SUBJECTIVE:  ? ?*** ? ?PAIN:  ?Are you having pain? Yes: NPRS scale: 1-2/10 ?Pain location: BIL low back ?Pain description: achy in middle, sharp "nerve pain" in BIL low back ?Aggravating factors: laying supine, bending over, prolonged standing >15 minutes, and lifting >15 pounds ?Relieving factors: massage, sitting ? ? ? ? ?OBJECTIVE:  ? *Unless otherwise noted, objective information collected previously* ? ?DIAGNOSTIC FINDINGS:  ?02/28/2022: XR Lumbar Spine 2-3 Views: Radiographs of her lumbar spine demonstrate well-maintained and preserved  ?disc spaces.  She has no acute fractures.  Cannot visualize the SI joint  ?very well because of bowel pattern.  No degenerative changes no listhesis ?  ?PATIENT SURVEYS:  ?Modified Oswestry 16/50, 32%  ?  ?SCREENING FOR RED FLAGS: ?Bowel or bladder incontinence: No ?Cauda equina syndrome: No ?  ?  ?COGNITION: ?          Overall cognitive status: Within functional limits for tasks assessed               ?           ?SENSATION: ?WFL ?  ?MUSCLE  LENGTH: ?Thomas test: Moderate complexity BIL ?  ?POSTURE:  ?Mildly increased lumbar lordosis ?  ?PALPATION: ?TTP to Lt lower thoracic/ upper lumbar paraspinals ?  ?PASSIVE ACCESSORIES: ?Hypomobile and painful T8-L1 ?  ?LUMBAR ROM:  ?  ?Active  AROM  ?03/17/2022  ?Flexion WNL  ?Extension WNL  ?Right lateral flexion WNL  ?Left lateral flexion WNL  ?Right rotation WNL  ?Left rotation WNL  ? (Blank rows = not tested) ?  ?  ?LE MMT: ?  ?MMT Right ?03/17/2022 Left ?03/17/2022  ?Hip flexion 4+/5 4+/5  ?Hip extension 3+/5 3+/5  ?Hip abduction 4/5 4/5  ? (Blank rows = not tested) ?  ?LUMBAR SPECIAL TESTS:  ?Sacral thrust: (+) ?Pelvic compression: (+) ?Pelvic distraction: (-) ?Hip thrust: (+) BIL ?Gaenslen's: (+) on Rt ?PLE: (-) ?SLR: (-) ?Active SLR: (-) ?Fortin's sign: (+) BIL ? ?03/24/2022: Piriformis test (+) BIL ?  ?FUNCTIONAL TESTS:  ?5xSTS: 8 seconds ?Squat: WNL ?Plank: 24 seconds ?  ?03/24/2022: SIJ directional preference established in Lt innominate counter-nutation ? ?No leg-length discrepancy identified  ? ?  ?TODAY'S TREATMENT  ? ?[facility] Adult PT Treatment:                                                DATE: 04/08/2022 ?Therapeutic Exercise: ?*** ?Manual Therapy: ?*** ?  Neuromuscular re-ed: ?*** ?Therapeutic Activity: ?*** ?Modalities: ?*** ?Self Care: ?*** ? ? ?[facility] Adult PT Treatment:                                                DATE: 03/30/2022 ?Therapeutic Exercise: ?Bicycle crunches 3x20 ?Dead bug with ball - 3x10 ?Elliptical - 4' ?Side plank - 30'' 2x ea ?Prone superman 3x10 with 5-sec hold ?Prone press-up 2x30sec ?15# kettlebell swings 3x15 ?Down and upward chops with chop bar and (10# down, 3# up cable 2x10 BIL) ?Lumbar ext row - 3x10 - 40# ?Golfers stretch 10x ? ?[facility] Adult PT Treatment:                                                DATE: 03/28/2022 ?Therapeutic Exercise: ?Bicycle crunches 3x20 ?Prone superman 3x10 with 3-sec hold ?Prone press-up 2x30sec ?15# kettlebell swings 3x15 ?Mini-squat side steps with 10#  cable x4 BIL ?Down shops with chop bar and 10# cable 2x10 BIL ?Standing golfer's stretch x10 BIL ?Tall-kneeling hip extension with knees on Airex pad and 23# cable 1x10, 17# cable 1x10 ?Manual Therapy: ?Sidelying lumbar roll grade V manipulation x1 BIL with cavitation ?Prone thoracic grade V manipulation x4 throughout lumbar spine with cavitation ?Neuromuscular re-ed: ?N/A ?Therapeutic Activity: ?N/A ?Modalities: ?N/A ?Self Care: ?N/A ? ?  ?  ?  ?PATIENT EDUCATION:  ?Education details: Pt educated on probable underlying pathophysiology behind her pain presentation, prognosis, POC, ODI, HEP ?Person educated: Patient ?Education method: Explanation, Demonstration, and Handouts ?Education comprehension: verbalized understanding and returned demonstration ?  ?  ?HOME EXERCISE PROGRAM: ?  ?Access Code: [email or web address] ?URL: [email or web address] ?Date: 03/17/2022 ?Prepared by: Carmelina Dane ?  ?Exercises ?- Plank on Knees  - 1 x daily - 7 x weekly - 3 sets - 30-60 seconds hold ?- Modified Side Plank with Hip Abduction  - 1 x daily - 7 x weekly - 3 sets - 10 reps ?- SIJ directional preference rocks  - 1 x daily - 7 x weekly - 3 sets - 10 reps ?- Hip Flexor Stretch at Edge of Bed  - 1 x daily - 7 x weekly - 2-min hold ?  ?  ?ASSESSMENT: ?  ?CLINICAL IMPRESSION: ?*** ?  ?  ?OBJECTIVE IMPAIRMENTS decreased ROM, decreased strength, hypomobility, impaired flexibility, improper body mechanics, postural dysfunction, and pain.  ?  ?ACTIVITY LIMITATIONS cleaning, community activity, laundry, yard work, and shopping.  ?  ?PERSONAL FACTORS 1-2 comorbidities: Depression, chronic headaches  are also affecting patient's functional outcome.  ?  ?  ?REHAB POTENTIAL: Good ?  ?CLINICAL DECISION MAKING: Stable/uncomplicated ?  ?EVALUATION COMPLEXITY: Low ?  ?  ?GOALS: ?Goals reviewed with patient? Yes ?  ?SHORT TERM GOALS: Target date: 04/14/2022 ?  ?Pt will report understanding and adherence to her HEP in order to promote  independence in the management of her primary impairments. ?Baseline: HEP provided at eval ?Goal status: INITIAL ?  ?  ?LONG TERM GOALS: Target date: 05/12/2022 ?  ?Pt will achieve an ODI score of 20% or lower in order to demonstrate improved functional ability as it relates to her low back pain. ?Baseline: 32% ?Goal status: INITIAL ?  ?2.  Pt will achieve BIL global hip strength to 5/5  in order to return to an independent LE strengthening regimen with less limitation. ?Baseline: See MMT chart ?Goal status: INITIAL ?  ?3.  Pt will report ability to stand >30 minutes with 0-2/10 pain in order to wash dishes without limitation. ?Baseline: Unable to stand > 15 minutes without >7/10 pain ?Goal status: INITIAL ?  ?4.  Pt will demonstrate ability to dead lift 30# with 0-2/10 pain in order to lift her child as she grows up without limitation. ?Baseline: Pt reports >7/10 pain when lifting >15 pounds ?Goal status: INITIAL ?  ?  ?  ?PLAN: ?PT FREQUENCY: 2x/week ?  ?PT DURATION: 8 weeks ?  ?PLANNED INTERVENTIONS: Therapeutic exercises, Therapeutic activity, Neuromuscular re-education, Patient/Family education, Joint manipulation, Joint mobilization, Dry Needling, Electrical stimulation, Spinal manipulation, Spinal mobilization, Cryotherapy, Moist heat, Taping, and Manual therapy. ?  ?PLAN FOR NEXT SESSION: Progress core/ hip strength ? ? ?Carmelina Dane, PT, DPT ?04/07/22 1:31 PM ? ? ? ?  ? ?

## 2022-04-08 ENCOUNTER — Ambulatory Visit: Payer: Medicaid Other

## 2022-04-11 ENCOUNTER — Inpatient Hospital Stay: Admission: RE | Admit: 2022-04-11 | Payer: Medicaid Other | Source: Ambulatory Visit

## 2022-04-12 ENCOUNTER — Ambulatory Visit: Payer: Medicaid Other

## 2022-04-14 ENCOUNTER — Ambulatory Visit: Payer: Medicaid Other

## 2022-04-14 NOTE — Therapy (Incomplete)
?OUTPATIENT PHYSICAL THERAPY TREATMENT NOTE ? ? ?Patient Name: Gloria Orr ?MRN: [id number] ?DOB:2001-08-23, 21 y.o., female ?Today's Date: 04/14/2022 ? ?PCP: Patient, No Pcp Per (Inactive) ?REFERRING PROVIDER: Persons, [place], [place] ? ? ? ? ? ?Past Medical History:  ?Diagnosis Date  ? Chronic headaches   ? Depression with suicidal ideation   ? Eczema   ? Seborrheic keratosis of scalp   ? Sinusitis   ? Stress incontinence   ? ?Past Surgical History:  ?Procedure Laterality Date  ? NO PAST SURGERIES    ? ?Patient Active Problem List  ? Diagnosis Date Noted  ? Low back pain 04/05/2022  ? Postpartum depression 02/08/2022  ? 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 ? ?REFERRING DIAG: M54.50,G89.29 (ICD-10-CM) - Chronic bilateral low back pain, unspecified whether sciatica present ? ?THERAPY DIAG:  ?No diagnosis found. ? ?PERTINENT HISTORY: Depression, chronic headaches ? ?SUBJECTIVE:  ? ?*** ? ?PAIN:  ?Are you having pain? Yes: NPRS scale: 1-2/10 ?Pain location: BIL low back ?Pain description: achy in middle, sharp "nerve pain" in BIL low back ?Aggravating factors: laying supine, bending over, prolonged standing >15 minutes, and lifting >15 pounds ?Relieving factors: massage, sitting ? ? ? ? ?OBJECTIVE:  ? *Unless otherwise noted, objective information collected previously* ? ?DIAGNOSTIC FINDINGS:  ?02/28/2022: XR Lumbar Spine 2-3 Views: Radiographs of her lumbar spine demonstrate well-maintained and preserved  ?disc spaces.  She has no acute fractures.  Cannot visualize the SI joint  ?very well because of bowel pattern.  No degenerative changes no listhesis ?  ?PATIENT SURVEYS:  ?Modified Oswestry 16/50, 32%  ?  ?SCREENING FOR RED FLAGS: ?Bowel or bladder incontinence: No ?Cauda equina syndrome: No ?  ?  ?COGNITION: ?          Overall cognitive status: Within functional limits for tasks assessed               ?           ?SENSATION: ?WFL ?  ?MUSCLE  LENGTH: ?Thomas test: Moderate complexity BIL ?  ?POSTURE:  ?Mildly increased lumbar lordosis ?  ?PALPATION: ?TTP to Lt lower thoracic/ upper lumbar paraspinals ?  ?PASSIVE ACCESSORIES: ?Hypomobile and painful T8-L1 ?  ?LUMBAR ROM:  ?  ?Active  AROM  ?03/17/2022  ?Flexion WNL  ?Extension WNL  ?Right lateral flexion WNL  ?Left lateral flexion WNL  ?Right rotation WNL  ?Left rotation WNL  ? (Blank rows = not tested) ?  ?  ?LE MMT: ?  ?MMT Right ?03/17/2022 Left ?03/17/2022  ?Hip flexion 4+/5 4+/5  ?Hip extension 3+/5 3+/5  ?Hip abduction 4/5 4/5  ? (Blank rows = not tested) ?  ?LUMBAR SPECIAL TESTS:  ?Sacral thrust: (+) ?Pelvic compression: (+) ?Pelvic distraction: (-) ?Hip thrust: (+) BIL ?Gaenslen's: (+) on Rt ?PLE: (-) ?SLR: (-) ?Active SLR: (-) ?Fortin's sign: (+) BIL ? ?03/24/2022: Piriformis test (+) BIL ?  ?FUNCTIONAL TESTS:  ?5xSTS: 8 seconds ?Squat: WNL ?Plank: 24 seconds ?  ?03/24/2022: SIJ directional preference established in Lt innominate counter-nutation ? ?No leg-length discrepancy identified  ? ?  ?TODAY'S TREATMENT  ? ?[facility] Adult PT Treatment:                                                DATE: 04/14/2022 ?Therapeutic Exercise: ?*** ?Manual Therapy: ?*** ?  Neuromuscular re-ed: ?*** ?Therapeutic Activity: ?*** ?Modalities: ?*** ?Self Care: ?*** ? ? ?[facility] Adult PT Treatment:                                                DATE: 03/30/2022 ?Therapeutic Exercise: ?Bicycle crunches 3x20 ?Dead bug with ball - 3x10 ?Elliptical - 4' ?Side plank - 30'' 2x ea ?Prone superman 3x10 with 5-sec hold ?Prone press-up 2x30sec ?15# kettlebell swings 3x15 ?Down and upward chops with chop bar and (10# down, 3# up cable 2x10 BIL) ?Lumbar ext row - 3x10 - 40# ?Golfers stretch 10x ? ?[facility] Adult PT Treatment:                                                DATE: 03/28/2022 ?Therapeutic Exercise: ?Bicycle crunches 3x20 ?Prone superman 3x10 with 3-sec hold ?Prone press-up 2x30sec ?15# kettlebell swings 3x15 ?Mini-squat side steps with 10#  cable x4 BIL ?Down shops with chop bar and 10# cable 2x10 BIL ?Standing golfer's stretch x10 BIL ?Tall-kneeling hip extension with knees on Airex pad and 23# cable 1x10, 17# cable 1x10 ?Manual Therapy: ?Sidelying lumbar roll grade V manipulation x1 BIL with cavitation ?Prone thoracic grade V manipulation x4 throughout lumbar spine with cavitation ?Neuromuscular re-ed: ?N/A ?Therapeutic Activity: ?N/A ?Modalities: ?N/A ?Self Care: ?N/A ? ?  ?  ?  ?PATIENT EDUCATION:  ?Education details: Pt educated on probable underlying pathophysiology behind her pain presentation, prognosis, POC, ODI, HEP ?Person educated: Patient ?Education method: Explanation, Demonstration, and Handouts ?Education comprehension: verbalized understanding and returned demonstration ?  ?  ?HOME EXERCISE PROGRAM: ?  ?Access Code: [email or web address] ?URL: [email or web address] ?Date: 03/17/2022 ?Prepared by: Carmelina Dane ?  ?Exercises ?- Plank on Knees  - 1 x daily - 7 x weekly - 3 sets - 30-60 seconds hold ?- Modified Side Plank with Hip Abduction  - 1 x daily - 7 x weekly - 3 sets - 10 reps ?- SIJ directional preference rocks  - 1 x daily - 7 x weekly - 3 sets - 10 reps ?- Hip Flexor Stretch at Edge of Bed  - 1 x daily - 7 x weekly - 2-min hold ?  ?  ?ASSESSMENT: ?  ?CLINICAL IMPRESSION: ?*** ?  ?  ?OBJECTIVE IMPAIRMENTS decreased ROM, decreased strength, hypomobility, impaired flexibility, improper body mechanics, postural dysfunction, and pain.  ?  ?ACTIVITY LIMITATIONS cleaning, community activity, laundry, yard work, and shopping.  ?  ?PERSONAL FACTORS 1-2 comorbidities: Depression, chronic headaches  are also affecting patient's functional outcome.  ?  ?  ?REHAB POTENTIAL: Good ?  ?CLINICAL DECISION MAKING: Stable/uncomplicated ?  ?EVALUATION COMPLEXITY: Low ?  ?  ?GOALS: ?Goals reviewed with patient? Yes ?  ?SHORT TERM GOALS: Target date: 04/14/2022 ?  ?Pt will report understanding and adherence to her HEP in order to promote  independence in the management of her primary impairments. ?Baseline: HEP provided at eval ?Goal status: INITIAL ?  ?  ?LONG TERM GOALS: Target date: 05/12/2022 ?  ?Pt will achieve an ODI score of 20% or lower in order to demonstrate improved functional ability as it relates to her low back pain. ?Baseline: 32% ?Goal status: INITIAL ?  ?2.  Pt will achieve BIL global hip strength to 5/5  in order to return to an independent LE strengthening regimen with less limitation. ?Baseline: See MMT chart ?Goal status: INITIAL ?  ?3.  Pt will report ability to stand >30 minutes with 0-2/10 pain in order to wash dishes without limitation. ?Baseline: Unable to stand > 15 minutes without >7/10 pain ?Goal status: INITIAL ?  ?4.  Pt will demonstrate ability to dead lift 30# with 0-2/10 pain in order to lift her child as she grows up without limitation. ?Baseline: Pt reports >7/10 pain when lifting >15 pounds ?Goal status: INITIAL ?  ?  ?  ?PLAN: ?PT FREQUENCY: 2x/week ?  ?PT DURATION: 8 weeks ?  ?PLANNED INTERVENTIONS: Therapeutic exercises, Therapeutic activity, Neuromuscular re-education, Patient/Family education, Joint manipulation, Joint mobilization, Dry Needling, Electrical stimulation, Spinal manipulation, Spinal mobilization, Cryotherapy, Moist heat, Taping, and Manual therapy. ?  ?PLAN FOR NEXT SESSION: Progress core/ hip strength ? ? ?Carmelina Dane, PT, DPT ?04/14/22 10:58 AM ? ? ? ?  ? ?

## 2022-04-20 ENCOUNTER — Telehealth: Payer: Self-pay | Admitting: General Practice

## 2022-04-20 NOTE — Telephone Encounter (Signed)
Dr Trilby Drummer from [facility] called and left message on nurse voicemail line requesting a call back to discuss elevated [place] score in office on this patient. Called her back and stated I would pass along the information to our [facility]. She verbalized understanding. ?

## 2022-04-22 ENCOUNTER — Telehealth: Payer: Self-pay | Admitting: Clinical

## 2022-04-22 NOTE — Telephone Encounter (Signed)
Attempt to f/u with pt, per referral; Left HIPPA-compliant message to call back Roselyn Reef from [employer] for [employer] at [facility] at  [phone] [facility] office); left MyChart message as well.  ?

## 2022-05-02 ENCOUNTER — Telehealth: Payer: Self-pay

## 2022-05-02 NOTE — Telephone Encounter (Signed)
Tried to call patient. Call wouldn't go through. Her MRI of her l-spine is being denied. They want her to try PT first. I have sent a mychart message to patient explaining this. I have ask her to let us know where she wants to do PT at and I'll then enter the referral.  ?

## 2022-05-13 ENCOUNTER — Telehealth: Payer: Self-pay

## 2022-05-13 NOTE — Telephone Encounter (Signed)
Yes-peer to peer

## 2022-05-13 NOTE — Telephone Encounter (Signed)
Patient's MRI L-Spine has been denied due to not trying PT first. A peer to peer can be done. She has done PT. call [phone] use order ID [id number]  Thanks!

## 2022-05-17 ENCOUNTER — Other Ambulatory Visit: Payer: Self-pay

## 2022-05-17 DIAGNOSIS — G8929 Other chronic pain: Secondary | ICD-10-CM

## 2022-05-17 NOTE — Telephone Encounter (Signed)
Reorder MRI

## 2022-05-17 NOTE — Telephone Encounter (Signed)
Messaged patient through MyChart. Waiting for response.

## 2022-05-17 NOTE — Telephone Encounter (Signed)
Please call pt and inquire how many weeks of PT she completed

## 2022-05-24 NOTE — Telephone Encounter (Signed)
Thanks

## 2022-05-29 ENCOUNTER — Ambulatory Visit
Admission: RE | Admit: 2022-05-29 | Discharge: 2022-05-29 | Disposition: A | Discharge status: Home / Self Care | Payer: Medicaid Other | Source: Ambulatory Visit | Attending: Orthopaedic Surgery | Admitting: Orthopaedic Surgery

## 2022-05-29 DIAGNOSIS — G8929 Other chronic pain: Secondary | ICD-10-CM

## 2022-07-06 ENCOUNTER — Encounter: Payer: Self-pay | Admitting: Orthopaedic Surgery

## 2022-07-06 ENCOUNTER — Ambulatory Visit (INDEPENDENT_AMBULATORY_CARE_PROVIDER_SITE_OTHER): Payer: Medicaid Other | Admitting: Orthopaedic Surgery

## 2022-07-06 DIAGNOSIS — M545 Low back pain, unspecified: Secondary | ICD-10-CM | POA: Diagnosis not present

## 2022-07-06 DIAGNOSIS — G8929 Other chronic pain: Secondary | ICD-10-CM | POA: Diagnosis not present

## 2022-07-06 NOTE — Progress Notes (Signed)
Office Visit Note   Patient: Gloria Orr           Date of Birth: 2001/07/29           MRN: [id number] Visit Date: 07/06/2022              Requested by: No referring provider defined for this encounter. PCP: Patient, No Pcp Per   Assessment & Plan: Visit Diagnoses:  1. Chronic bilateral low back pain without sciatica     Plan: MRI scan lumbar spine was absolutely normal.  Ms Rosado relates onset of low back pain during her pregnancy over a year ago.  She continues to have pain in her back without any referred pain to either lower extremity.  She notes she tried physical therapy for a month or so and had to stop because it created more pain.  Again MRI scan was perfectly normal.  Exam was also benign as she did not have any flank pain or significant tenderness of her thoracic or lumbar spine.  Long discussion regarding her continued pain.  Certainly she can try over-the-counter medicine.  We will give her a set of back exercises and ask her to do as many as she can without hurting.  I think that over time that would make a huge difference.  I also asked her to about bowel and bladder function and GI and GU function she notes it does not been any problem and that she has been checked by her family physician for lab work which were all been normal.  I know she is frustrated but hopefully over time that the exercises will make a difference like to see her in 6 or 8 weeks just see if she is any better  Follow-Up Instructions: Return in about 1 month (around 08/06/2022).   Orders:  No orders of the defined types were placed in this encounter.  No orders of the defined types were placed in this encounter.     Procedures: No procedures performed   Clinical Data: No additional findings.   Subjective: Chief Complaint  Patient presents with   Lower Back - Follow-up    MRI review  Patient presents today for follow up on her lower back. She had an MRI and is here to go over those  results.  HPI  Review of Systems   Objective: Vital Signs: There were no vitals taken for this visit.  Physical Exam Constitutional:      Appearance: She is well-developed.  Eyes:     Pupils: Pupils are equal, round, and reactive to light.  Pulmonary:     Effort: Pulmonary effort is normal.  Skin:    General: Skin is warm and dry.  Neurological:     Mental Status: She is alert and oriented to person, place, and time.  Psychiatric:        Behavior: Behavior normal.     Ortho Exam awake alert and oriented x3.  Comfortable sitting.  No acute distress.  She did tear up during the exam as she is frustrated with her back pain denies any pain with range of motion of her cervical spine.  No percussible tenderness of her thoracic spine.  No flank pain to percussion or lower lumbar pain to percussion.  No shortness of breath.  Pelvis was level.  Straight leg raise negative.  Motor exam intact.  Sensory exam intact.  Specialty Comments:  No specialty comments available.  Imaging: No results found.   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   Past Medical History:  Diagnosis Date   Chronic headaches    Depression with suicidal ideation    Eczema    Seborrheic keratosis of scalp    Sinusitis    Stress incontinence     Family History  Problem Relation Age of Onset   Asthma Mother    Hypertension Father    Diabetes Father    Heart disease Sister    Asthma Sister    Asthma Brother     Past Surgical History:  Procedure Laterality Date   NO PAST SURGERIES     Social History   Occupational History   Occupation: Ecologist    Comment: [employer]  Tobacco Use   Smoking status: Never   Smokeless tobacco: Never  Vaping Use   Vaping Use: Never used   Substance and Sexual Activity   Alcohol use: Not Currently   Drug use: Never   Sexual activity: Yes

## 2022-11-10 IMAGING — US US OB COMP LESS 14 WK
1 series · 15 of 28 positions shown · non-contrast
Comparison: None this pregnancy.

CLINICAL DATA: Pregnant patient in first-trimester pregnancy with
cramping and bleeding. Gestational age by LMP 6 weeks 3 days.

EXAM:
OBSTETRIC <14 WK US AND TRANSVAGINAL OB US
TECHNIQUE: Both transabdominal and transvaginal ultrasound examinations were
performed for complete evaluation of the gestation as well as the
maternal uterus, adnexal regions, and pelvic cul-de-sac.
Transvaginal technique was performed to assess early pregnancy.

[Series 1: us ob comp less 14 wk · 15 of 45 slices shown]
[im 1/45]
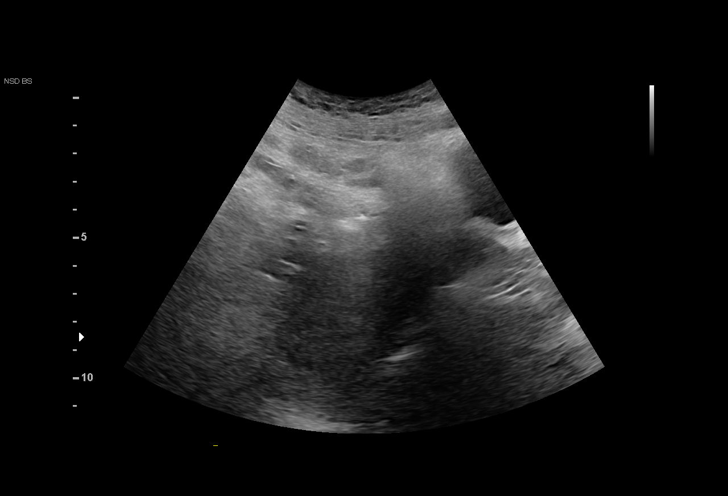
[im 4/45]
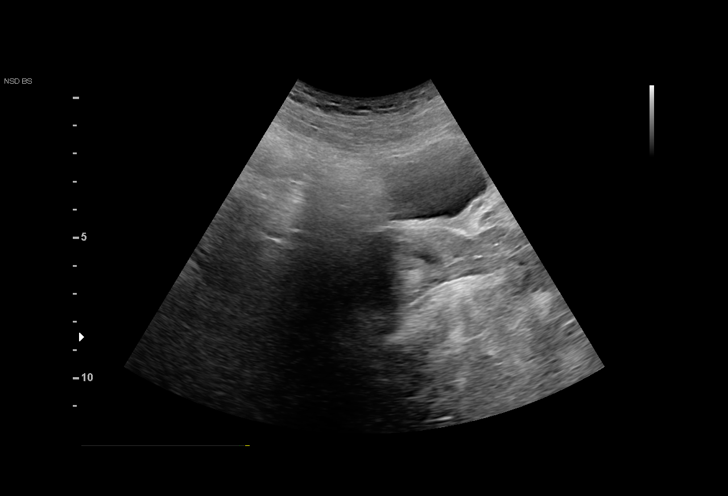
[im 7/45]
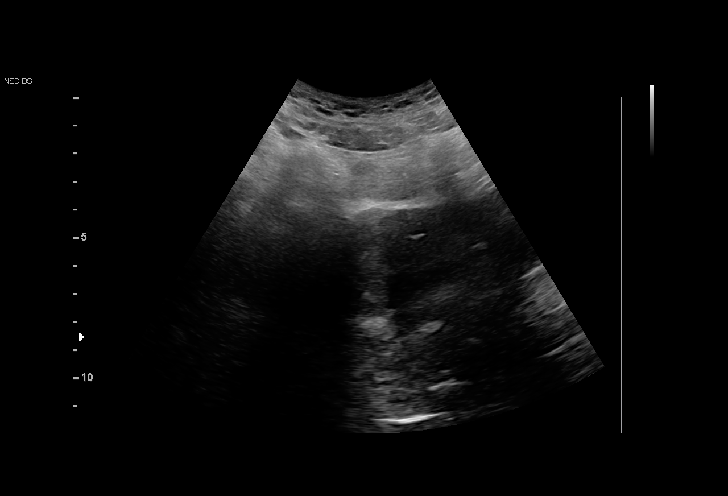
[im 10/45]
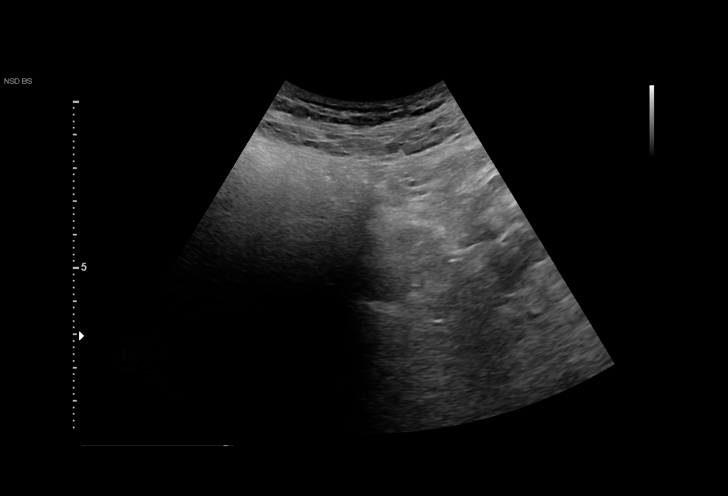
[im 14/45]
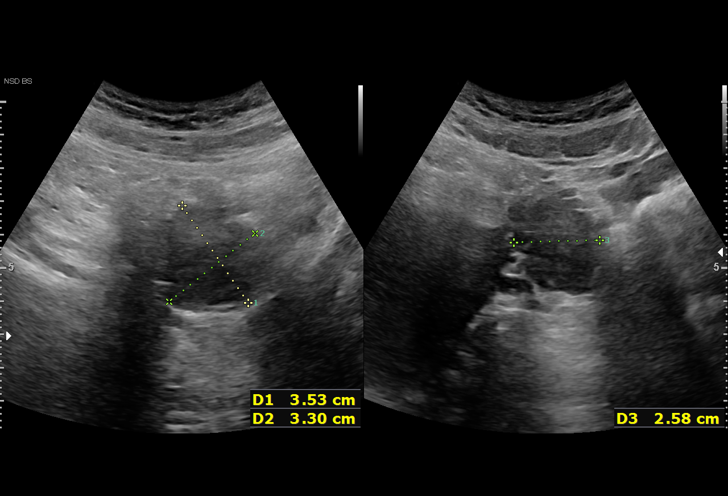
[im 17/45]
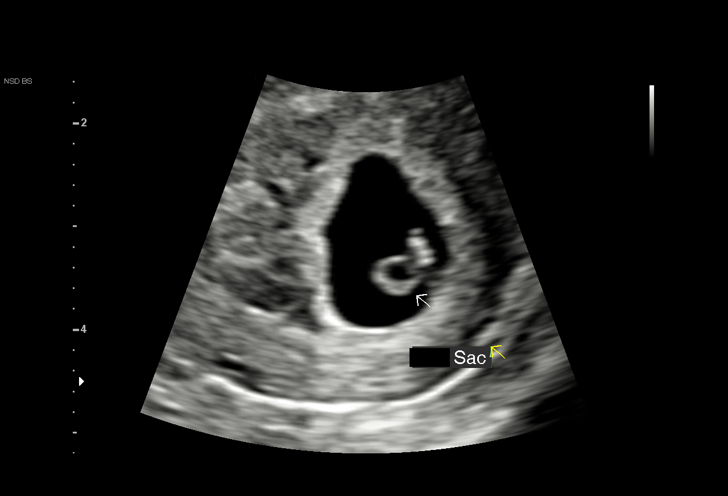
[im 20/45]
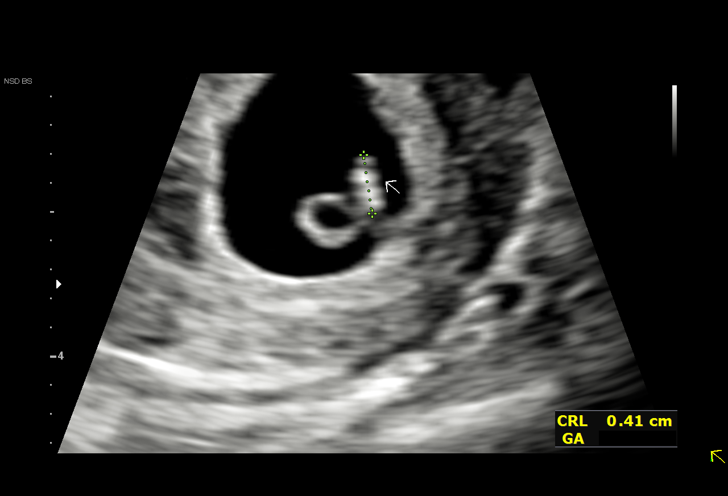
[im 23/45]
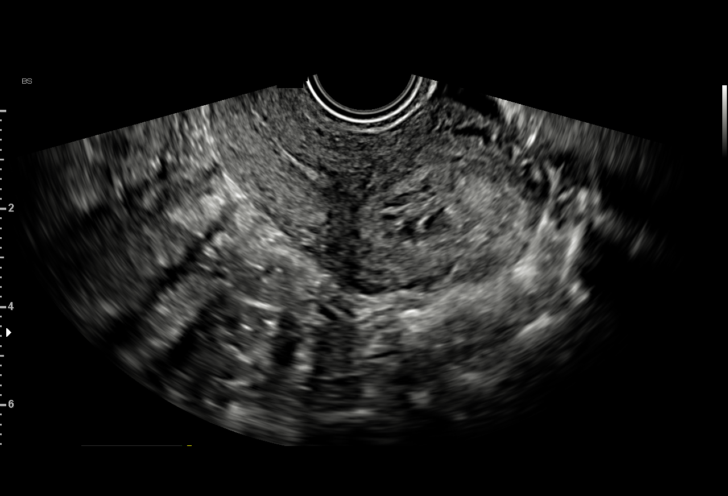
[im 25/45]
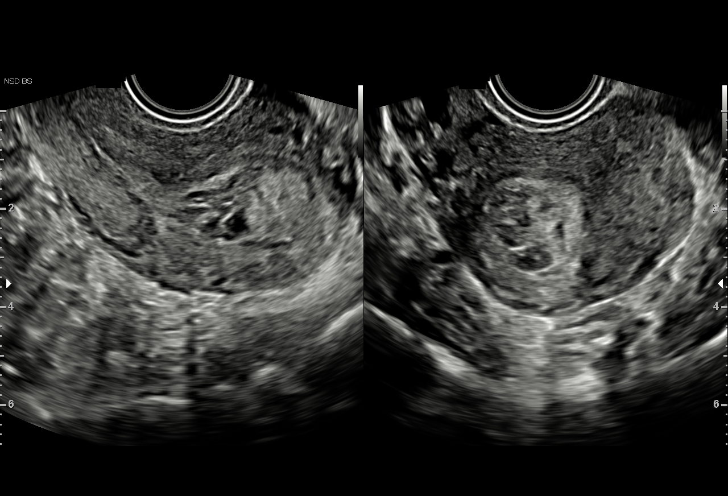
[im 28/45]
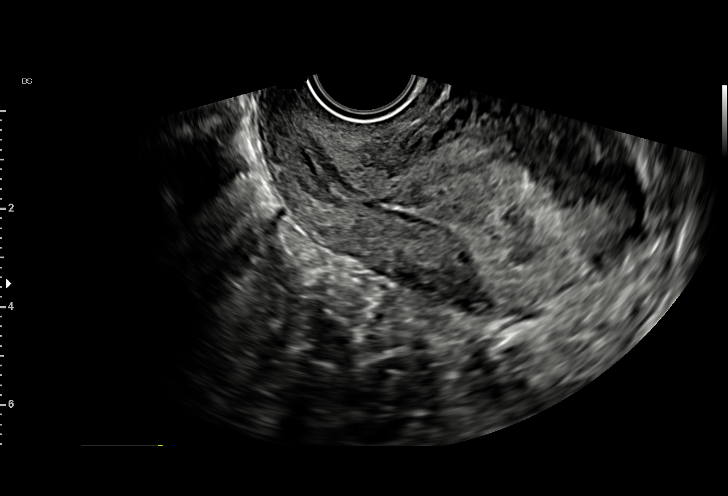
[im 31/45]
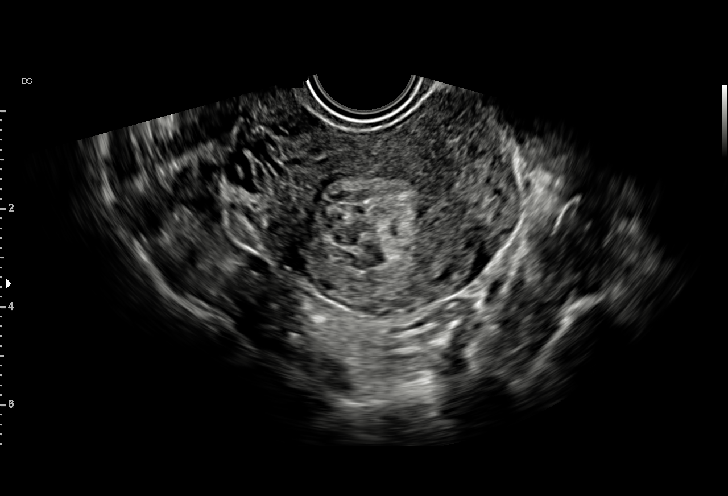
[im 35/45]
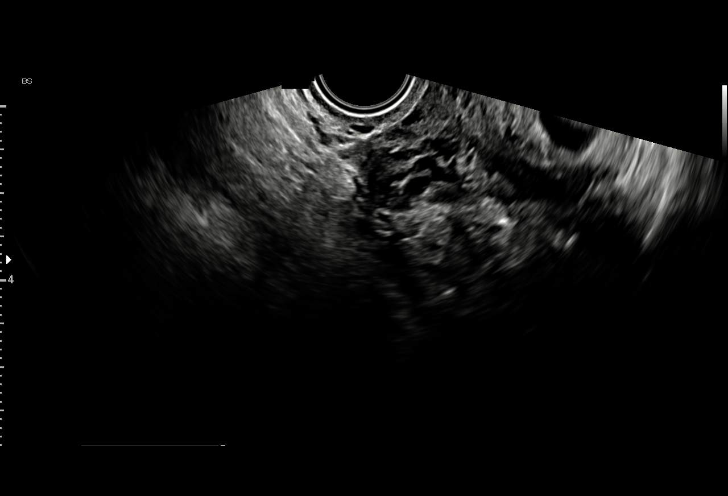
[im 38/45]
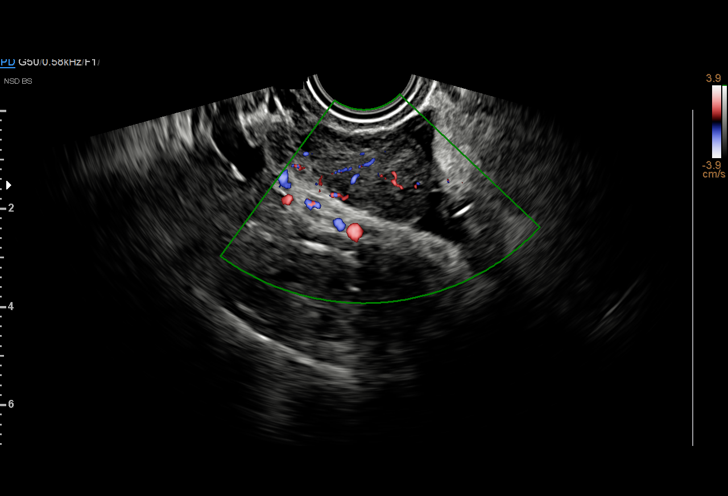
[im 41/45]
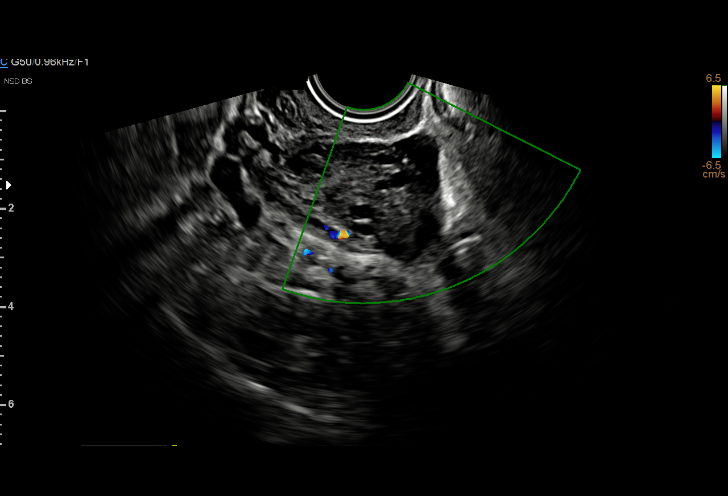
[im 45/45]
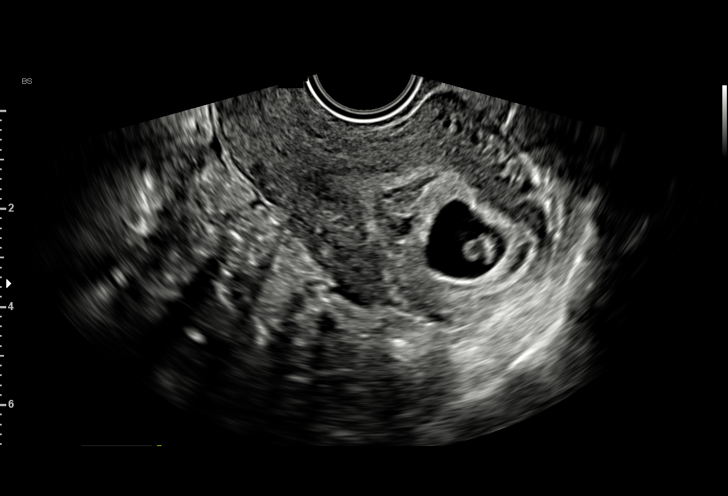

[15 of 28 positions shown; findings below may reference images not displayed]

FINDINGS: Intrauterine gestational sac: Single

Yolk sac:  Visualized.

Embryo:  Visualized.

Cardiac Activity: Visualized.

Heart Rate: 108 bpm

CRL:  4.1 mm   6 w   1 d                  US EDC: 12/10/2021

Subchorionic hemorrhage: Moderate measuring approximately 1.6 x
x 1.3 cm to the left of the gestational sac.

Maternal uterus/adnexae: The uterus is retroverted. Single live
intrauterine gestation with moderate subchorionic hemorrhage. The
right ovary is normal in size. Trace adjacent free fluid. Left ovary
is normal in size and contains a corpus luteal. Ovarian blood flow
is noted to both ovaries. No adnexal mass.
IMPRESSION: 1. Single live intrauterine pregnancy estimated gestational age 6
weeks 1 day based on crown-rump length for ultrasound EDC 12/10/2021
2. Moderate subchorionic hemorrhage.

## 2022-12-03 IMAGING — US US OB TRANSVAGINAL
1 series · 15 of 28 positions shown · non-contrast
Comparison: April 17, 2021.

CLINICAL DATA: Fetal viability.

EXAM:
TRANSVAGINAL OB ULTRASOUND
TECHNIQUE: Transvaginal ultrasound was performed for complete evaluation of the
gestation as well as the maternal uterus, adnexal regions, and
pelvic cul-de-sac.

[Series 1: us ob transvaginal · 15 of 57 slices shown]
[im 1/57]
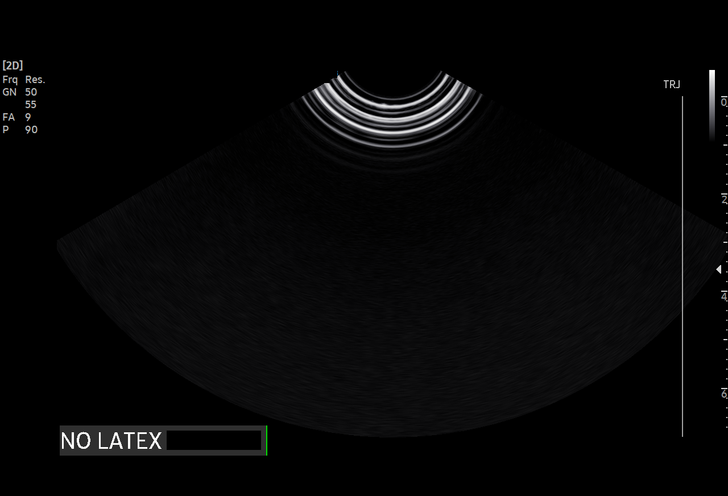
[im 5/57]
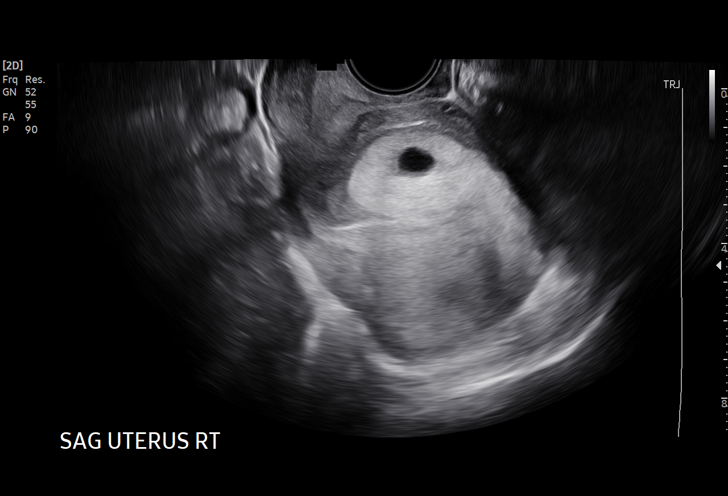
[im 9/57]
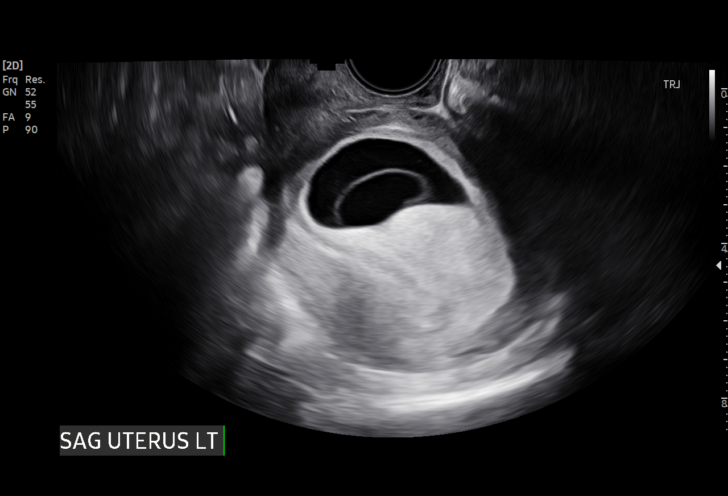
[im 13/57]
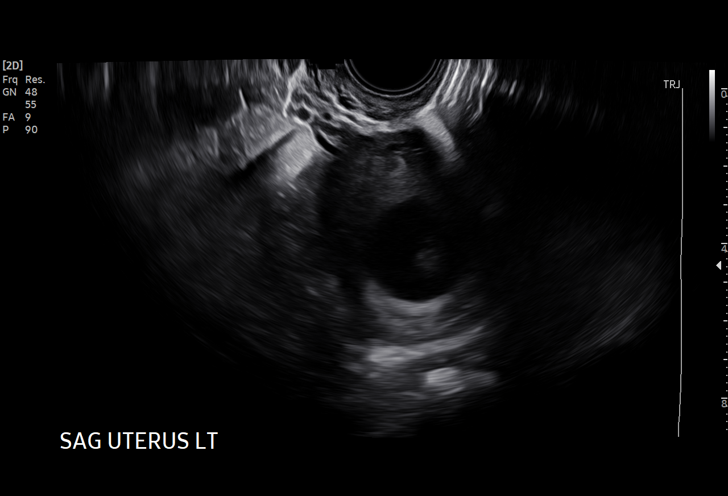
[im 17/57]
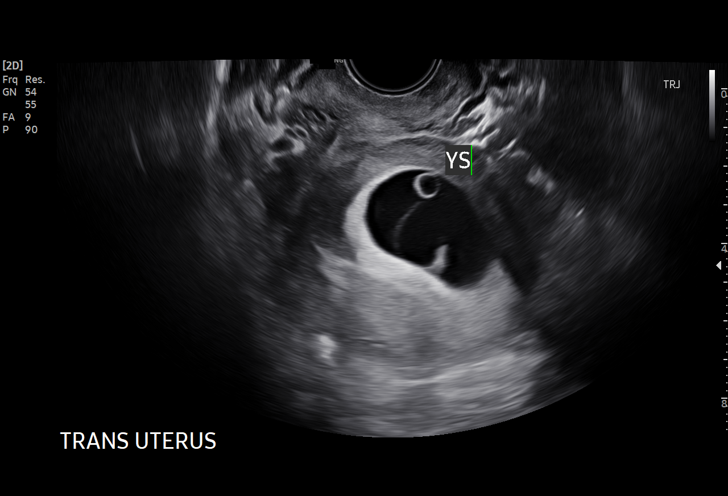
[im 21/57]
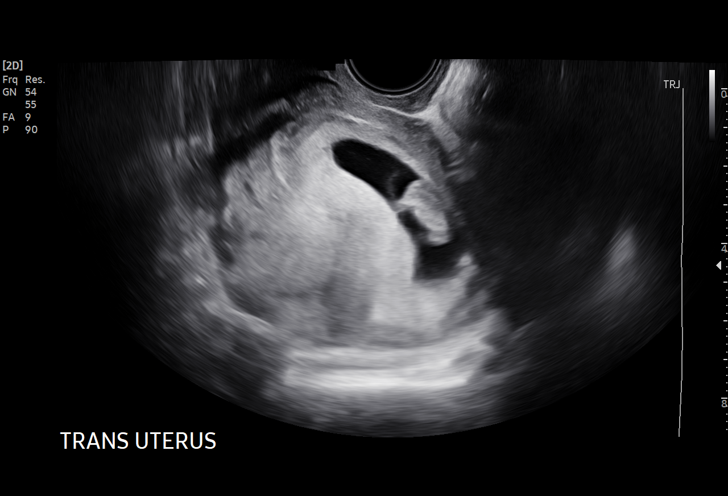
[im 25/57]
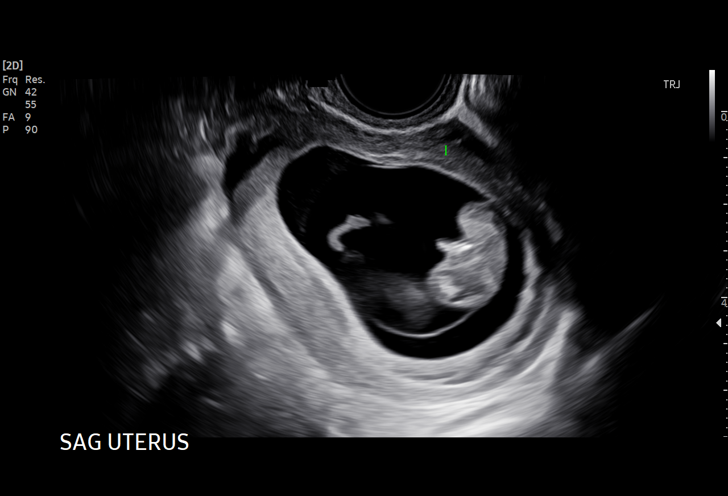
[im 30/57]
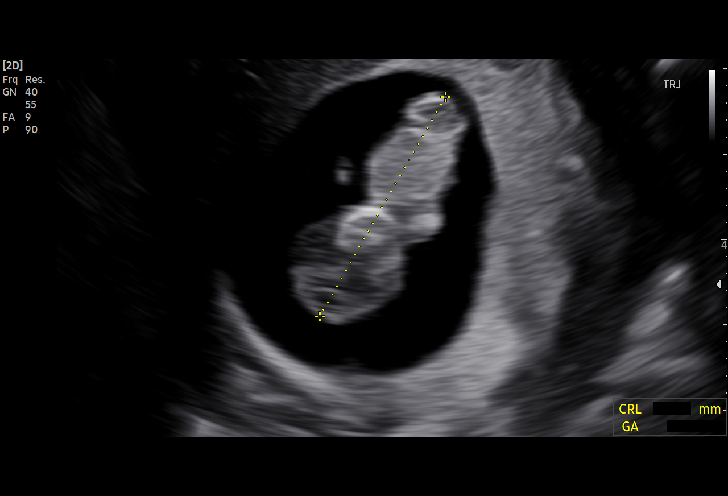
[im 32/57]
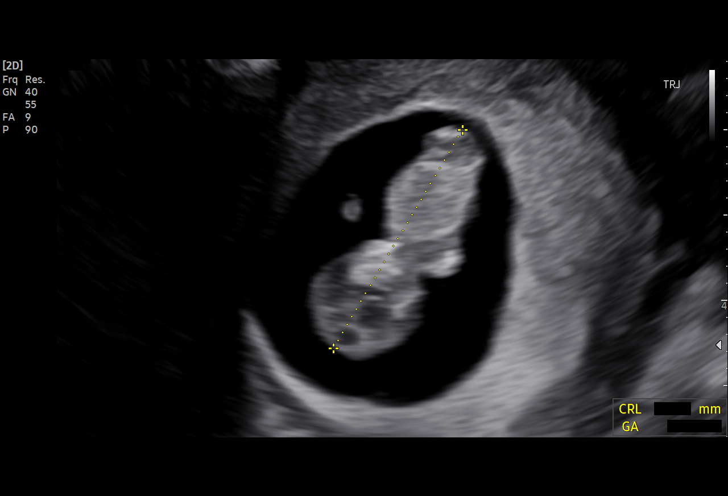
[im 36/57]
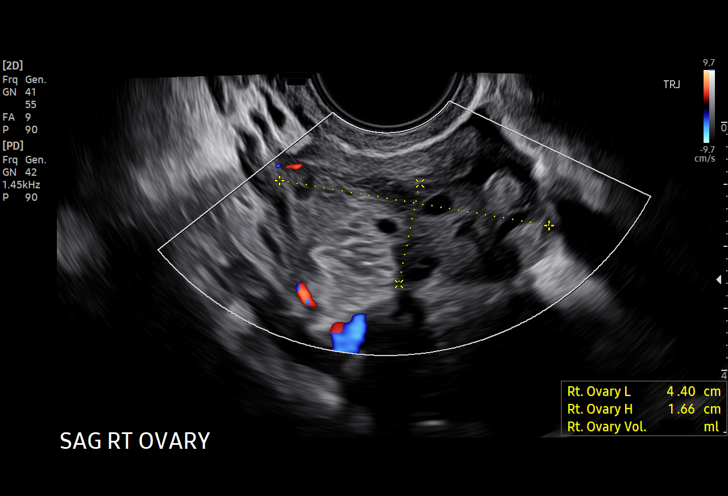
[im 40/57]
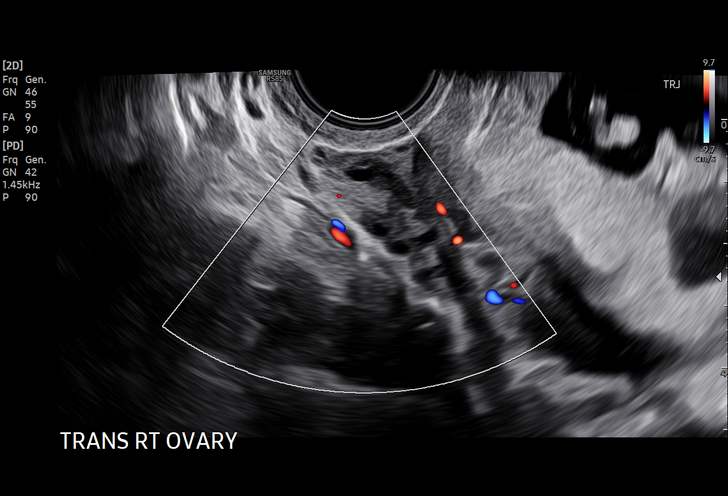
[im 44/57]
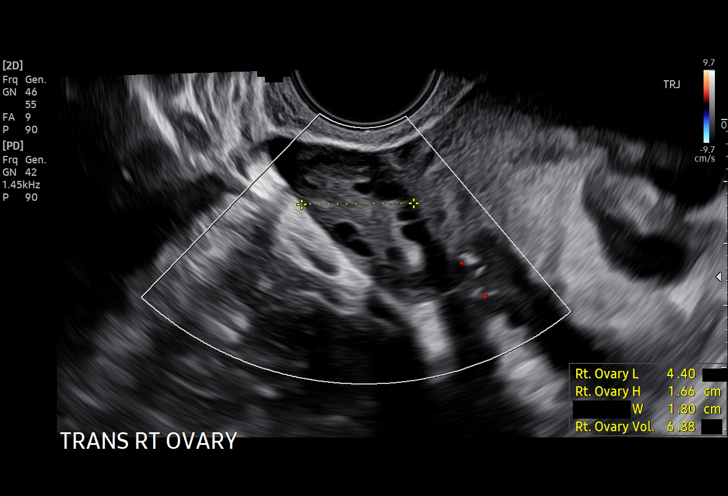
[im 48/57]
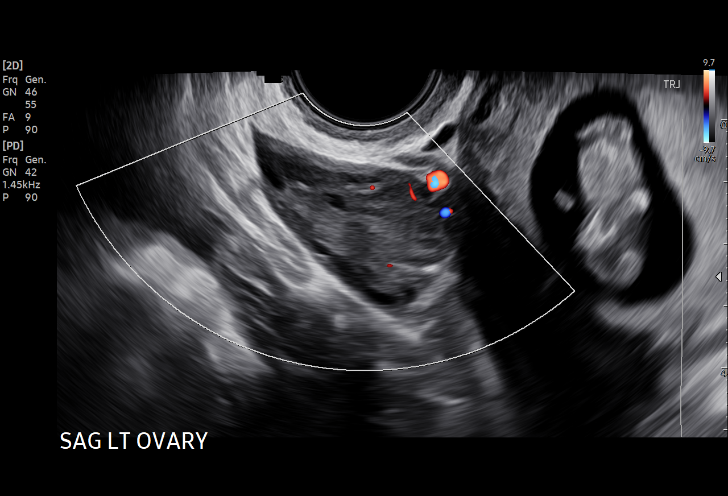
[im 52/57]
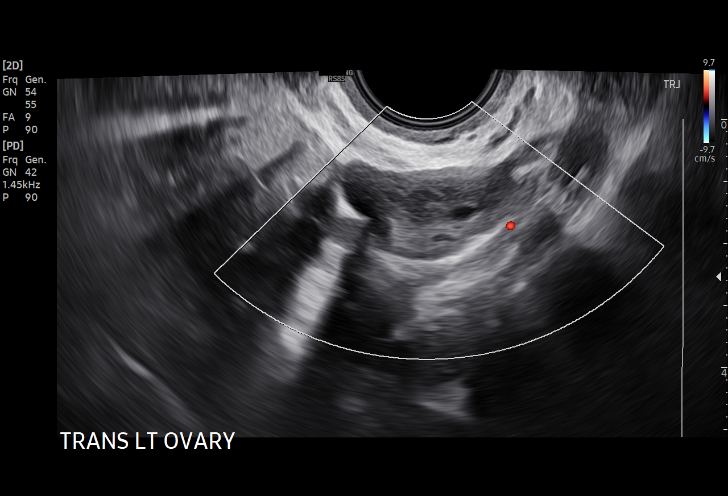
[im 57/57]
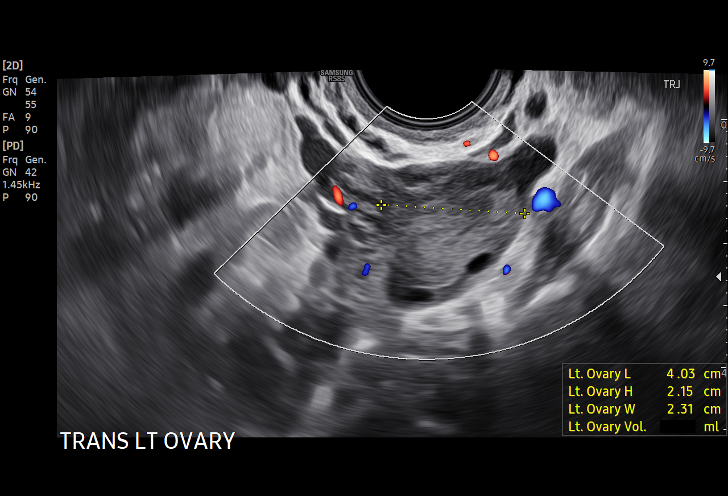

[15 of 28 positions shown; findings below may reference images not displayed]

FINDINGS: Intrauterine gestational sac: Single

Yolk sac:  Visualized.

Embryo:  Visualized.

Cardiac Activity: Visualized.

Heart Rate: 186 bpm

CRL:   29.4 mm   9 w 5 d                  US EDC: December 08, 2021.

Subchorionic hemorrhage:  Small subchorionic hemorrhage is noted.

Maternal uterus/adnexae: Corpus luteum cyst seen in right ovary.
Left ovary appears normal. Trace free fluid is noted which most
likely is physiologic.
IMPRESSION: Single live intrauterine gestation of 9 weeks 5 days. Small
subchorionic hemorrhage.

## 2023-03-04 IMAGING — US US MFM OB FOLLOW-UP
1 series · 13 of 28 positions shown · non-contrast
Comparison: none

[Series 1: us mfm ob follow-up · 13 of 97 slices shown]
[im 4/97]
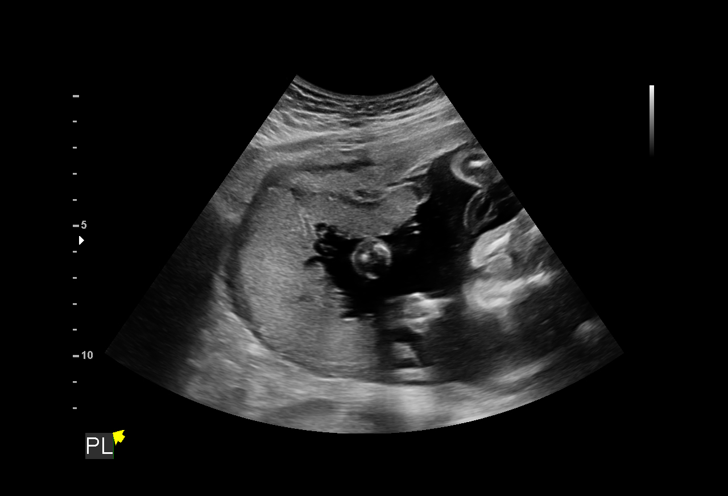
[im 11/97]
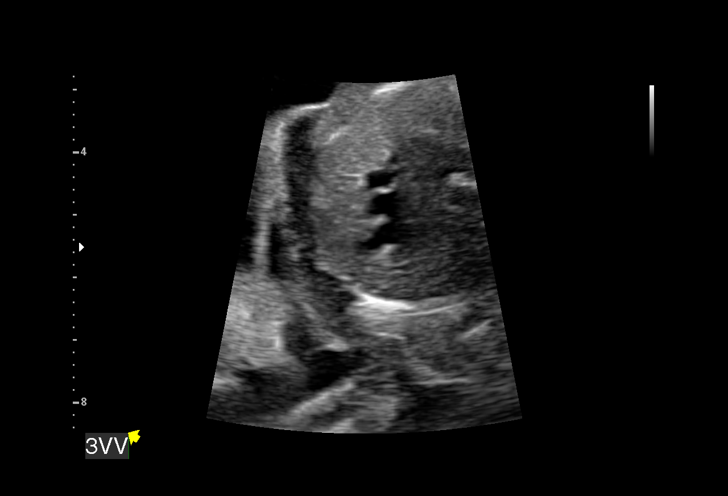
[im 18/97]
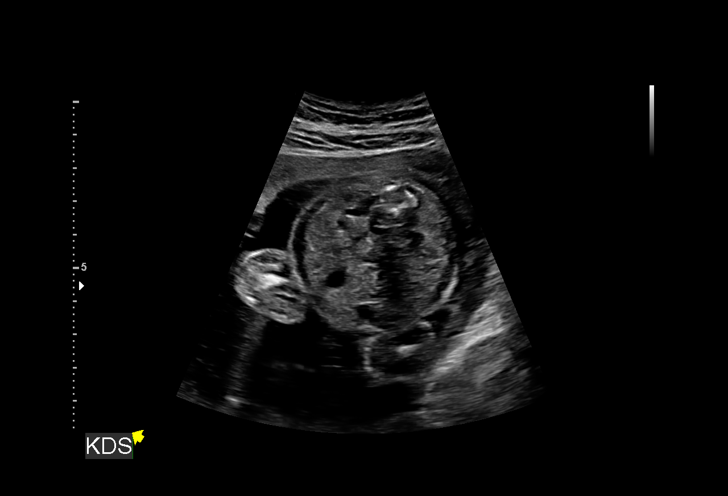
[im 25/97]
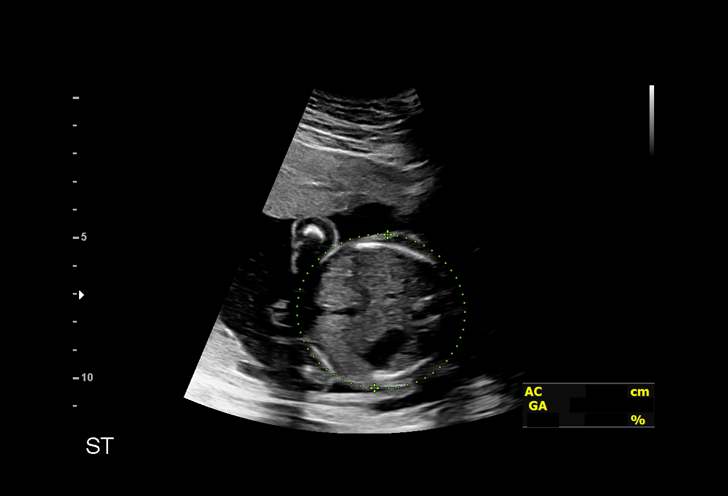
[im 33/97]
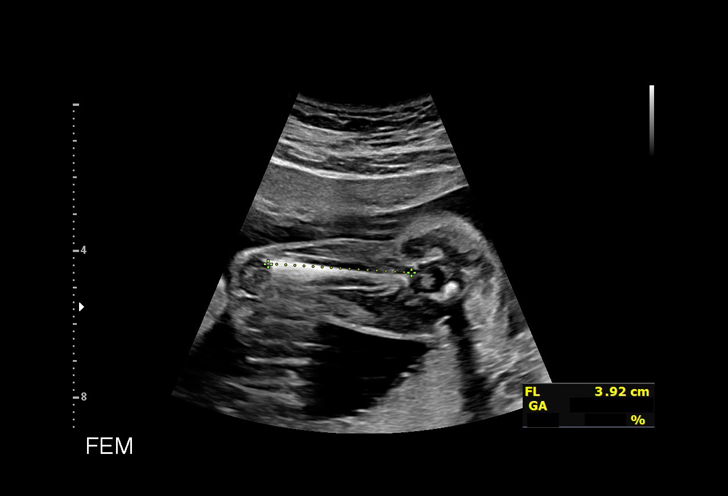
[im 40/97]
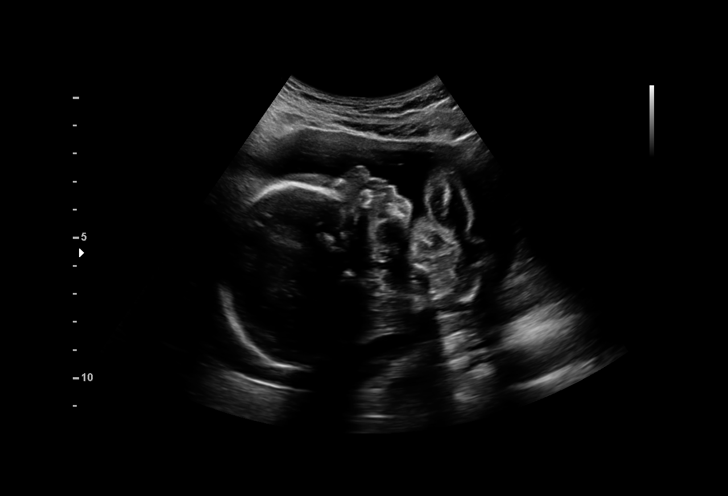
[im 50/97]
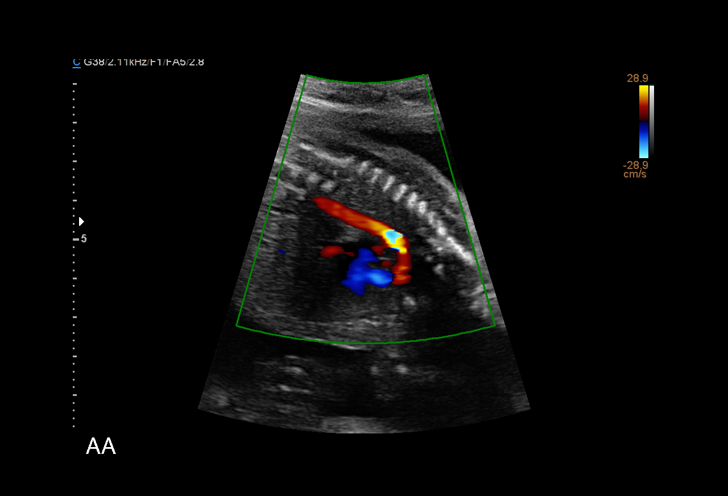
[im 57/97]
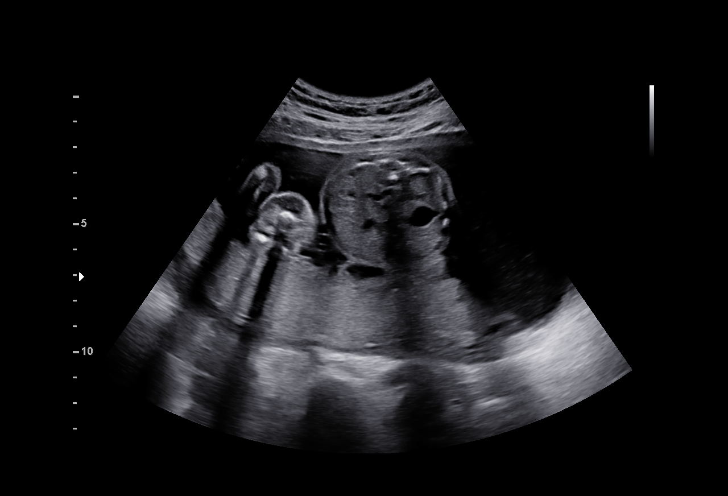
[im 65/97]
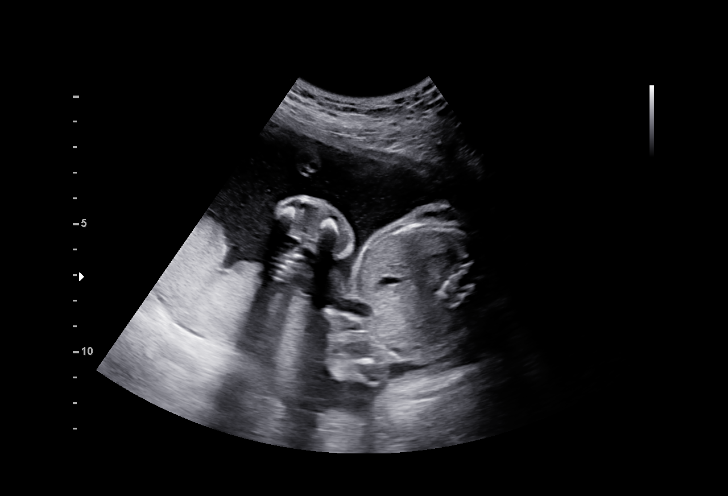
[im 72/97]
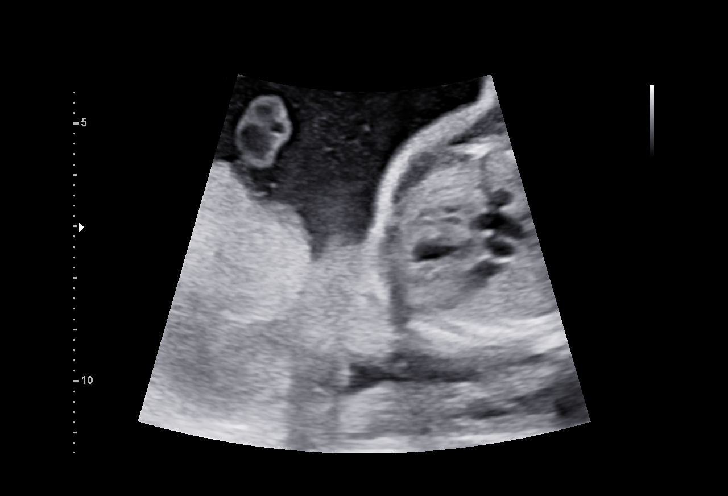
[im 79/97]
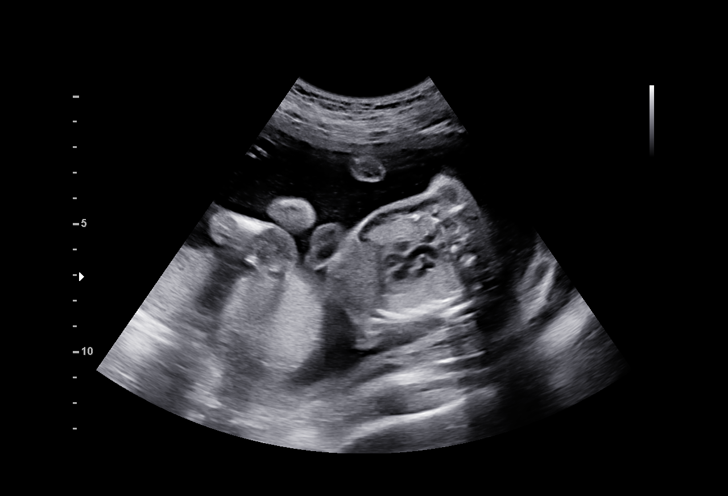
[im 86/97]
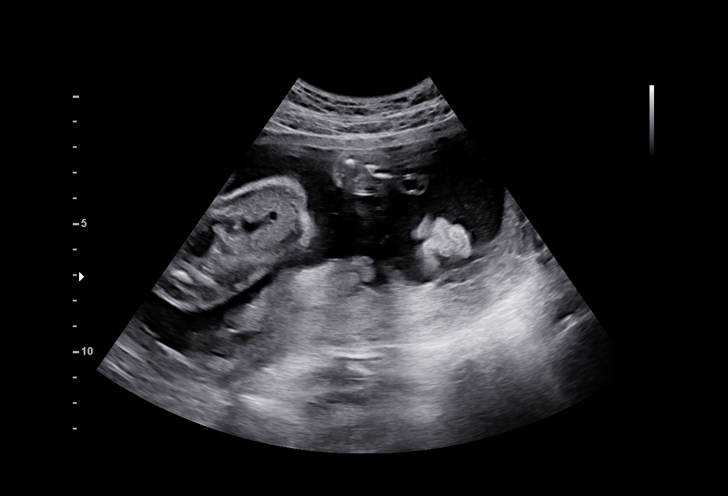
[im 93/97]
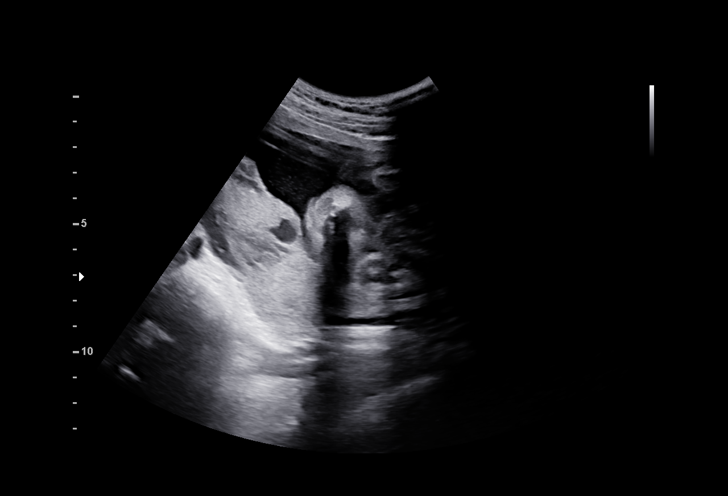

[13 of 28 positions shown; findings below may reference images not displayed]

Indications

 Fetal abnormality - other known or suspected
 Marginal insertion of umbilical cord affecting
 management of mother in second trimester
 22 weeks gestation of pregnancy
 Antenatal follow-up for nonvisualized fetal
 anatomy
Fetal Evaluation

 Num Of Fetuses:         1
 Preg. Location:         Intrauterine
 Fetal Heart Rate(bpm):  138
 Cardiac Activity:       Observed
 Presentation:           Cephalic
 Placenta:               Posterior

 Amniotic Fluid
 AFI FV:      Within normal limits

                             Largest Pocket(cm)

Biometry

 BPD:      55.8  mm     G. Age:  23w 0d         58  %    CI:        78.93   %    70 - 86
                                                         FL/HC:      19.4   %    19.2 -
 HC:      198.6  mm     G. Age:  22w 0d         13  %    HC/AC:      1.13        1.05 -
 AC:      176.1  mm     G. Age:  22w 4d         35  %    FL/BPD:     69.2   %    71 - 87
 FL:       38.6  mm     G. Age:  22w 3d         28  %    FL/AC:      21.9   %    20 - 24
 HUM:      35.4  mm     G. Age:  22w 2d         33  %
 Est. FW:     504  gm      1 lb 2 oz     30  %
OB History

 Gravidity:    2         Term:   0        Prem:   0        SAB:   1
 TOP:          0       Ectopic:  0        Living: 0
Gestational Age

 LMP:           22w 5d        Date:  03/03/21                 EDD:   12/08/21
 U/S Today:     22w 4d                                        EDD:   12/09/21
 Best:          22w 5d     Det. By:  LMP  (03/03/21)          EDD:   12/08/21
Anatomy

 Cranium:               Appears normal         Aortic Arch:            Appears normal
 Cavum:                 Appears normal         Ductal Arch:            Not well visualized
 Ventricles:            Appears normal         Diaphragm:              Appears normal
 Choroid Plexus:        Previously seen        Stomach:                Appears normal, left
                                                                       sided
 Cerebellum:            Previously seen        Abdomen:                Appears normal
 Posterior Fossa:       Previously seen        Abdominal Wall:         Appears nml (cord
                                                                       insert, abd wall)
 Nuchal Fold:           Not well visualized    Cord Vessels:           Previously seen
 Face:                  Profile appears        Kidneys:                Appear normal
                        normal
 Lips:                  Previously seen        Bladder:                Appears normal
 Palate:                Not well visualized    Spine:                  Previously seen
 Heart:                 Appears normal         Upper Extremities:      Visualized
                        (4CH, axis, and
                        situs)
 RVOT:                  Appears normal         Lower Extremities:      Visualized
 LVOT:                  Appears normal

 Other:  Fetus appears to be female. 3VV and 3VTV visualized.
Cervix Uterus Adnexa

 Cervix
 Normal appearance by transabdominal scan.

 Uterus
 No abnormality visualized.

 Right Ovary
 Not visualized.

 Left Ovary
 Not visualized.

 Cul De Sac
 No free fluid seen.

 Adnexa
 No abnormality visualized.
Comments

 Serrano Yamada was seen for a follow up exam due to an
 atypical finding noted on her cell free DNA test.  Her cell free
 DNA test showed atypical findings that was outside the scope
 of the test.  The origin of the atypical finding involves
 chromosome 13, could not be specified.  [facility] did not
 recommend repeat cell free DNA testing.  Due to the atypical
 finding, no results were reported for trisomy 21, 18, and 13.
 The fetal gender could not be deciphered based on this test.
 The patient denies any problems since her last exam.
 She was informed that the fetal growth and amniotic fluid
 level appears appropriate for her gestational age.
 Although limited today due to the fetal position, there were no
 obvious fetal anomalies noted today.
 The patient is scheduled to meet with our genetic counselor
 tomorrow.  Due to the atypical finding noted on her cell free
 DNA test, she was offered and declined an amniocentesis
 today for definitive diagnosis of fetal aneuploidy.
 As the atypical finding noted on her cell free DNA test may be
 related to fetal and/or maternal mosaicism, fetal and/ or
 maternal chromosome abnormality, we will continue to follow
 her with growth ultrasounds throughout her pregnancy.
 A follow-up exam was scheduled in 4 weeks.
 A total of 15 minutes was spent counseling and coordinating
 the care for this patient.  Greater than 50% of the time was
 spent in direct face-to-face contact.
Recommendations

 Serial growth ultrasounds
 Repeat ultrasound in 4 weeks

## 2023-08-21 ENCOUNTER — Ambulatory Visit: Payer: Medicaid Other | Admitting: Family Medicine

## 2023-08-31 ENCOUNTER — Ambulatory Visit (INDEPENDENT_AMBULATORY_CARE_PROVIDER_SITE_OTHER): Payer: Medicaid Other | Admitting: Family Medicine

## 2023-08-31 ENCOUNTER — Other Ambulatory Visit: Payer: Self-pay

## 2023-08-31 ENCOUNTER — Encounter: Payer: Self-pay | Admitting: Family Medicine

## 2023-08-31 ENCOUNTER — Other Ambulatory Visit (HOSPITAL_COMMUNITY)
Admission: RE | Admit: 2023-08-31 | Discharge: 2023-08-31 | Disposition: A | Discharge status: Home / Self Care | Payer: Medicaid Other | Source: Ambulatory Visit | Attending: Family Medicine | Admitting: Family Medicine

## 2023-08-31 VITALS — BP 118/79 | HR 80 | Wt 143.5 lb

## 2023-08-31 DIAGNOSIS — Z30432 Encounter for removal of intrauterine contraceptive device: Secondary | ICD-10-CM | POA: Diagnosis not present

## 2023-08-31 DIAGNOSIS — N898 Other specified noninflammatory disorders of vagina: Secondary | ICD-10-CM | POA: Diagnosis present

## 2023-08-31 NOTE — Progress Notes (Signed)
   Subjective:    Patient ID: Gloria Orr is a 22 y.o. female presenting with No chief complaint on file.  on 08/31/2023  HPI: Has foul smelling vaginal discharge with odor. Wants to be checked for this. Pap is due 05/2024. Reports having some urinary spasm. Has strong amount of urge and overactive bladder. Leaks small amount with sneezing, coughing, jumping. Desires to have her IUD removed. Plans pregnancy in next 1 year.  Review of Systems  Constitutional:  Negative for chills and fever.  Respiratory:  Negative for shortness of breath.   Cardiovascular:  Negative for chest pain.  Gastrointestinal:  Negative for abdominal pain, nausea and vomiting.  Genitourinary:  Negative for dysuria.  Skin:  Negative for rash.      Objective:    BP 118/79   Pulse 80   Wt 143 lb 8 oz (65.1 kg)   Breastfeeding No   BMI 26.25 kg/m  Physical Exam Exam conducted with a chaperone present.  Constitutional:      General: She is not in acute distress.    Appearance: She is well-developed.  HENT:     Head: Normocephalic and atraumatic.  Eyes:     General: No scleral icterus. Cardiovascular:     Rate and Rhythm: Normal rate.  Pulmonary:     Effort: Pulmonary effort is normal.  Abdominal:     Palpations: Abdomen is soft.  Genitourinary:    Comments: BUS normal, vagina is pink and rugated, cervix is parous without lesion Musculoskeletal:     Cervical back: Neck supple.  Skin:    General: Skin is warm and dry.  Neurological:     Mental Status: She is alert and oriented to person, place, and time.    Procedure: Speculum placed inside vagina.  Cervix visualized.  Strings grasped with ring forceps.  IUD removed intact.      Assessment & Plan:  Vaginal odor - Check wet prep - Plan: Cervicovaginal ancillary only( [place])  Encounter for IUD removal - desires pregnancy - to resume PNV's.   Return if symptoms worsen or fail to improve.  Reva Bores, MD 08/31/2023 11:07 AM

## 2023-09-01 LAB — CERVICOVAGINAL ANCILLARY ONLY
Bacterial Vaginitis (gardnerella): NEGATIVE
Candida Glabrata: NEGATIVE
Candida Vaginitis: NEGATIVE
Chlamydia: NEGATIVE
Comment: NEGATIVE
Comment: NEGATIVE
Comment: NEGATIVE
Comment: NEGATIVE
Comment: NEGATIVE
Comment: NORMAL
Neisseria Gonorrhea: NEGATIVE
Trichomonas: NEGATIVE

## 2023-11-29 ENCOUNTER — Other Ambulatory Visit: Payer: Self-pay

## 2023-11-29 DIAGNOSIS — R7989 Other specified abnormal findings of blood chemistry: Secondary | ICD-10-CM

## 2023-12-01 ENCOUNTER — Other Ambulatory Visit: Payer: Medicaid Other

## 2023-12-04 ENCOUNTER — Ambulatory Visit: Payer: Medicaid Other | Admitting: "Endocrinology

## 2023-12-04 ENCOUNTER — Encounter: Payer: Self-pay | Admitting: "Endocrinology

## 2023-12-04 VITALS — BP 90/54 | HR 70 | Ht 62.0 in | Wt 140.2 lb

## 2023-12-04 DIAGNOSIS — R7989 Other specified abnormal findings of blood chemistry: Secondary | ICD-10-CM

## 2023-12-04 NOTE — Progress Notes (Signed)
Outpatient Endocrinology Note Gloria Orr Celeste, MD    Pinkey K[place]en 2001/08/31 [id number]  Referring Provider: Ralene Ok, MD Primary Care Provider: Patient, No Pcp Per Reason for consultation: Subjective   Assessment & Plan  Diagnoses and all orders for this visit:  Low serum cortisol level   Patient has c/o fatigue, hair fall and brain fog and was checked cortisol which came back low at 3 at 3:25 pm  Sleeps 10-11:30 pm and wakes up 7-8 am Not on steroids Denies any abdominal pain/nausea/vomiting/weight loss  Ordered repeat cortisol, if low will follow with ACTH stimulation test   No follow-ups on file.   I have reviewed current medications, nurse's notes, allergies, vital signs, past medical and surgical history, family medical history, and social history for this encounter. Counseled patient on symptoms, examination findings, lab findings, imaging results, treatment decisions and monitoring and prognosis. The patient understood the recommendations and agrees with the treatment plan. All questions regarding treatment plan were fully answered.  Gloria Orr , MD  12/04/23   History of Present Illness HPI  Gloria Orr Musgraves is a 22 y.o. year old female who presents for evaluation of low cortisol.  Patient has c/o fatigue, hair fall and brain fog and was checked cortisol which came back low at 3 at 3:25 pm  Sleeps 10-11:30 pm and wakes up 7-8 am Not on steroids Denies any abdominal pain/nausea/vomiting/weight loss   She weight change No moon face No fat pads Yes, mild dorsocervical increased girth No plethora No hyperpigmentation No purple striae No proximal muscle weakness No acne No, had it in past  vellus/terminal hirsutism No scalp hair loss Yes a history of HTN No a history of hypokalemia No paroxysmal episodes of anxiety No tremors No lightheadedness No headache Yes palpitation Yes, rare sweating No blurry vision No  Physical Exam  BP (!)  90/54   Pulse 70   Ht 5\' 2"  (1.575 m)   Wt 140 lb 3.2 oz (63.6 kg)   SpO2 96%   BMI 25.64 kg/m    Constitutional: well developed, well nourished Head: normocephalic, atraumatic Eyes: sclera anicteric, no redness Neck: supple Lungs: normal respiratory effort Neurology: alert and oriented Skin: dry, no appreciable rashes Musculoskeletal: no appreciable defects Psychiatric: normal mood and affect   Current Medications Patient's Medications  New Prescriptions   No medications on file  Previous Medications   MISC. DEVICES (GOJJI WEIGHT SCALE) MISC    1 Device by Does not apply route once a week. Please check weight once a week and record in [facility].   PRENATAL VIT-FE FUMARATE-FA (PRENATAL VITAMINS) 28-0.8 MG TABS    Take 1 tablet by mouth daily.  Modified Medications   No medications on file  Discontinued Medications   No medications on file    Allergies No Known Allergies  Past Medical History Past Medical History:  Diagnosis Date   Chronic headaches    Depression with suicidal ideation    Eczema    Seborrheic keratosis of scalp    Sinusitis    Stress incontinence     Past Surgical History Past Surgical History:  Procedure Laterality Date   NO PAST SURGERIES      Family History family history includes Asthma in her brother, mother, and sister; Diabetes in her father; Heart disease in her sister; Hypertension in her father.  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 [place]   Currently unemployed   Lives   Caffeine use:   Social Determinants of Health   Financial Resource Strain: Not on file  Food Insecurity: No Food Insecurity (11/22/2021)   Hunger Vital Sign    Worried About Running Out of Food in the Last Year: Never true    Ran Out of Food in the Last Year: Never true  Transportation Needs: No Transportation Needs (11/22/2021)   PRAPARE - Administrator, Civil Service (Medical): No    Lack of Transportation (Non-Medical): No  Physical Activity: Not on file  Stress: Not on file  Social Connections: Not on file  Intimate Partner Violence: Not on file    No results found for: "CHOL" No results found for: "HDL" No results found for: "LDLCALC" No results found for: "TRIG" No results found for: "CHOLHDL" No results found for: "CREATININE" No results found for: "GFR" No results found for: "NA", "K", "CL", "CO2", "GLUCOSE", "BUN", "CREATININE", "CALCIUM", "PROT", "ALBUMIN", "AST", "ALT", "ALKPHOS", "BILITOT", "GFRNONAA", "GFRAA"     No data to display             Component Value Date/Time   WBC 6.9 12/09/2021 1639   RBC 4.63 12/09/2021 1639   HGB 14.0 12/09/2021 1639   HGB 12.3 09/15/2021 0852   HCT 42.1 12/09/2021 1639   HCT 35.9 09/15/2021 0852   PLT 179 12/09/2021 1639   PLT 205 09/15/2021 0852   MCV 90.9 12/09/2021 1639   MCV 88 09/15/2021 0852   MCH 30.2 12/09/2021 1639   MCHC 33.3 12/09/2021 1639   RDW 13.5 12/09/2021 1639   RDW 12.2 09/15/2021 0852   LYMPHSABS 1.3 06/02/2021 1552   MONOABS 0.6 04/21/2020 1237   EOSABS 0.0 06/02/2021 1552   BASOSABS 0.0 06/02/2021 1552   No results found for: "TSH", "FREET4"       Parts of this note may have been dictated using voice recognition software. There may be variances in spelling and vocabulary which are unintentional. Not all errors are proofread. Please notify the [facility] Parkin if any  discrepancies are noted or if the meaning of any statement is not clear.

## 2023-12-09 LAB — ALDOSTERONE + RENIN ACTIVITY W/ RATIO
ALDO / PRA Ratio: 3.9 {ratio} (ref 0.9–28.9)
Aldosterone: 8 ng/dL
Renin Activity: 2.05 ng/mL/h (ref 0.25–5.82)

## 2023-12-09 LAB — METANEPHRINES, PLASMA
Metanephrine, Free: <25 pg/mL (ref <=57)
Normetanephrine, Free: 59 pg/mL (ref <=148)
Total Metanephrines-Plasma: 59 pg/mL (ref <=205)

## 2023-12-09 LAB — ACTH: C206 ACTH: 23 pg/mL (ref 6–50)

## 2023-12-09 LAB — DHEA-SULFATE: DHEA-SO4: 163 ug/dL (ref 14–349)

## 2023-12-09 LAB — CORTISOL: Cortisol, Plasma: 7.7 ug/dL

## 2024-01-08 ENCOUNTER — Other Ambulatory Visit: Payer: Medicaid Other

## 2024-01-10 ENCOUNTER — Ambulatory Visit: Payer: Medicaid Other | Admitting: "Endocrinology

## 2024-02-22 ENCOUNTER — Telehealth: Payer: Self-pay

## 2024-02-22 NOTE — Telephone Encounter (Signed)
 Please call pt to schedule lab work.

## 2024-02-23 NOTE — Telephone Encounter (Signed)
 LMx1 for patient to call to schedule lab appointment.

## 2024-02-26 NOTE — Telephone Encounter (Signed)
 LMx1 to schedule lab appointment.

## 2024-03-01 ENCOUNTER — Other Ambulatory Visit: Payer: Self-pay

## 2024-03-01 ENCOUNTER — Telehealth: Payer: Self-pay | Admitting: "Endocrinology

## 2024-03-01 NOTE — Telephone Encounter (Signed)
 Patient called because she thought her lab appointment was today but its scheduled for next Friday. There are no orders in for patient right now and patients provider is already gone for the day. She wanted to know if another provider could put in labs for her so she can get done today. Please advise

## 2024-03-08 ENCOUNTER — Other Ambulatory Visit

## 2024-06-26 ENCOUNTER — Telehealth: Payer: Self-pay | Admitting: "Endocrinology

## 2024-06-26 NOTE — Telephone Encounter (Signed)
 Patient called this morning asking to schedule an appointment,she recently had to cancel and had lab work orders.Those lab order are no longer there due to being over 3 months old.Patient wants to know if she should have the labs requested again before being seen by Dr.Motwani? Please advise.

## 2024-06-27 NOTE — Telephone Encounter (Signed)
 Patient called this morning asking to schedule her appointments.Per Dr.Motwani,she needs a cosyntropin stim test,I am not seeing the orders for that in her chart.Please advise so I can get her scheduled for that lab and follow up.Thank you

## 2024-07-01 ENCOUNTER — Telehealth: Payer: Self-pay

## 2024-07-01 DIAGNOSIS — R7989 Other specified abnormal findings of blood chemistry: Secondary | ICD-10-CM

## 2024-07-01 NOTE — Telephone Encounter (Signed)
 Order placed for ACTH  Stim test lab per Dr Dartha.

## 2024-09-09 ENCOUNTER — Other Ambulatory Visit

## 2024-10-06 NOTE — Progress Notes (Signed)
 Gloria Orr is a 23 y.o.  with  reports that she has never smoked. She has never used smokeless tobacco. with  Active Ambulatory Problems    Diagnosis Date Noted  . No Active Ambulatory Problems   Resolved Ambulatory Problems    Diagnosis Date Noted  . No Resolved Ambulatory Problems   No Additional Past Medical History   who presents today at Urgent Care for  Chief Complaint  Patient presents with  . Vaginal Discharge    Itchiness, irritation, and abnormal discharge since Friday night.  Patient is concerned for a yeast infection.     HPI  Gloria Orr is a 23 year old who presents to clinic with a recurrent vaginitis.  She had vaginal irritation, external itching, and white discharge about 2 months ago.  She self diagnosed a yeast infection, and found over-the-counter medication in the [place] where she was visiting.  This included a vaginal suppository and external clotrimazole cream.  She is not sure what the suppository was.  She felt like symptoms did resolve at the time.  Over the past 2 days she has had recurrence of symptoms which includes external itchy rash and a white cottage cheese type discharge.  She has had no vaginal pain, burning with urination, increased urinary frequency, back pain, pelvic pain, fever.     Patient has no co-morbidities  or medications that might increase the risk for developing a severe infection     reports that she has never smoked. She has never used smokeless tobacco.  Review of Systems  Review of systems is otherwise negative except as noted in the HPI and Assessment/MDM   Physical Exam  BP 121/83 (BP Location: Left arm, Patient Position: Sitting)   Pulse 83   Temp 98.6 F (37 C) (Oral)   Resp 18   Wt 61.2 kg (135 lb)   LMP 09/14/2024 (Approximate)   SpO2 100%   Constitutional:      General: Patient is not in acute distress.    Appearance: Normal appearance.    Xray Results for this visit  No orders to display    Blood and Point  of Care over last 48 hours   Recent Results (from the past 48 hours)  POC Urinalysis Auto without Microscopic   Collection Time: 10/06/24  6:17 PM  Result Value Ref Range   Color, Urine Yellow Yellow   Clarity, Urine Slightly-Cloudy (A) Clear   Glucose, Urine Negative Negative mg/dL   Bilirubin, Urine Negative Negative   Ketones, Urine Trace (A) Negative mg/dL   Specific Gravity, Urine 1.020 1.010, 1.015, 1.020, 1.025   Blood, Urine Negative Negative   pH, Urine 7.0 5.0, 5.5, 6.0, 6.5, 7.0, 7.5, 8.0   Protein, Urine 30 (A) Negative mg/dL   Urobilinogen, Urine 1.0 <2.0 mg/dL   Nitrite, Urine Negative Negative   Leukocyte Esterase, Urine Small (A) Negative   Kit/Device Lot # [id number]    Kit/Device Expiration Date 06/24/25   POC HCG Qualitative, Urine   Collection Time: 10/06/24  6:19 PM  Result Value Ref Range   HCG, Urine, POC Negative Negative   Internal Control Acceptable    Specific Gravity, Urine 1.020 1.010, 1.015, 1.020, 1.025   Kit/Device Lot # [id number]    Kit/Device Expiration Date 11/24/2025     DIAGNOSIS/PLAN     1. Acute vaginitis  POC Urinalysis Auto without Microscopic   POC HCG Qualitative, Urine   Chlamydia / Gonococcus (GC), NAAT   Bacterial Vaginosis (BV), Candida, and Trichomonas via NAA (Swab)  This is a 23 year old who presents to clinic with recurrent vaginitis.  She self diagnosed a yeast infection about 2 months ago and used over-the-counter medications including a suppository and external clotrimazole cream while visiting the [place].  Symptoms resolved then but seem to have recurred 2 days ago.  Urine pregnancy test today is negative  UA today shows only small leukocytes without blood.  She has had no symptoms of UTI such as burning with urination, increased urinary frequency, back pain, or blood in the urine.  I have a low suspicion for UTI.   Patient collected self swab to check for yeast and B, and trichomoniasis..  I have a low suspicion for STI  because she is married and monogamous.  However will also check GC chlamydia via urine.  Given her clinical presentation, I suspect she has a vaginal yeast infection.  Will start on Diflucan 150 mg p.o.  Repeat dose after 3 days if symptoms have not completely resolved.  Patient will monitor her MyChart account for results.  She was advised if other test returned positive she will be contacted for additional treatment.    We discussed risks and side effects of medications, and also discussed red flags which would warrant immediate follow-up.   Urgent Care Disposition:  Home Care

## 2024-10-14 NOTE — Progress Notes (Signed)
 Call placed to patient, identity verified.  Patient reports she is no longer having any s/s that she had when she presented.  They have all resolved and she does not think she needs a recollection.

## 2024-11-06 NOTE — ED Provider Notes (Signed)
 [facility]  ED Provider Note  Darling Cieslewicz 23 y.o. female DOB: [birth year] MRN: [id number] History   Chief Complaint  Patient presents with  . Abdominal Pain    Pt sent by UC for lower abd pain, started approx 2 hours ago. Pt endorses N/V, denies diarrhea. Pt reports UC did a urine test that showed no UTI, no pregnancy.   Patient presents complaining of severe pelvic pain and cramping type sensation which started at 6:40 PM rated 10 out of 10 had some earlier cramping but not nearly as severe throughout the day with associated rectal discomfort no diarrhea or constipation no vaginal bleeding states is 3 days before her usual cycle  Patient went to urgent care evaluated and then sent here for further evaluation had a urine pregnancy test done which was negative urinalysis negative Patient's pain is presently 5 out of 10  Patient offers no past medical history Medications, none Allergies, none  Smoke none alcohol none  Operations, none  Birth control, none       History reviewed. No pertinent past medical history.  History reviewed. No pertinent surgical history.  Social History   Substance and Sexual Activity  Alcohol Use None   Tobacco Use History[1] E-Cigarettes  . Vaping Use    . Start Date    . Cartridges/Day    . Quit Date     Social History   Substance and Sexual Activity  Drug Use Not on file         Allergies[2]  Home Medications   No medications on file    Primary Survey  Primary Survey  Review of Systems   Review of Systems  Gastrointestinal:  Positive for abdominal pain. Negative for constipation, diarrhea, nausea and vomiting.  Genitourinary:  Positive for pelvic pain. Negative for dysuria.  Musculoskeletal:  Negative for back pain.  All other systems reviewed and are negative.   Physical Exam   ED Triage Vitals [11/06/24 2049]  BP 121/76  Heart Rate 74  Resp 18  SpO2 99 %  Temp 98.7 F  (37.1 C)    Physical Exam  Nursing note and vitals reviewed. Constitutional: She appears well-developed. She does not appear distressed.  HENT:  Head: Normocephalic and atraumatic.  Eyes: EOM are intact. Pupils are equal, round, and reactive to light.  Neck: Normal range of motion. Neck supple.  Cardiovascular: Normal rate and regular rhythm.  Pulmonary/Chest: Respiratory effort normal and breath sounds normal.  Abdominal: Soft. There is moderate abdominal tenderness in the suprapubic area.  Musculoskeletal: Normal range of motion.     Cervical back: Normal range of motion and neck supple.   Neurological: She is alert and oriented to person, place, and time.  Skin: Skin is warm. Skin is dry.  Psychiatric: She has a normal mood and affect.     ED Course   Lab results:   COMPREHENSIVE METABOLIC PANEL - Abnormal      Result Value   Na 138     Potassium 4.0     Cl 103     CO2 24     AGAP 11     Glucose 104 (*)    BUN 12     Creatinine 0.58     Ca 9.4     ALK PHOS 62     T Bili 0.2     Total Protein 7.7     Alb 4.7     GLOBULIN 3.0     ALBUMIN/GLOBULIN  RATIO 1.6     BUN/CREAT RATIO 20.7     ALT 10     AST 14     eGFR 129     Comment: Normal GFR (glomerular filtration rate) > 60 mL/min/1.73 meters squared, < 60 may include impaired kidney function. Calculation based on the Chronic Kidney Disease Epidemiology Collaboration (CK-EPI)equation refit without adjustment for race.  HUMAN CHORIONIC GONADOTROPIN  (HCG), BETA-SUBUNIT, QUALITATIVE - Normal   Beta HCG Qual Negative    LIPASE - Normal   Lipase 19    LACTIC ACID - Normal   Lactic Acid 0.8    CBC AND DIFFERENTIAL   WBC 6.9     RBC 4.70     HGB 13.1     HCT 39.7     MCV 84.5     MCH 27.9     MCHC 33.0     Plt Ct 272     RDW SD 37.9     MPV 11.0     NRBC% 0.0     Absolute NRBC Count 0.00     NEUTROPHIL % 70.2     LYMPHOCYTE % 23.3     MONOCYTE % 5.1     Eosinophil % 0.7     BASOPHIL % 0.6     IG% 0.1      ABSOLUTE NEUTROPHIL COUNT 4.82     ABSOLUTE LYMPHOCYTE COUNT 1.60     Absolute Monocyte Count 0.35     Absolute Eosinophil Count 0.05     Absolute Basophil Count 0.04     Absolute Immature Granulocyte Count 0.01    LIGHT BLUE TOP  GOLD SST    Imaging:   CT ABDOMEN PELVIS W IV CONTRAST   Narrative:    INDICATION: Abdominal Pain  TECHNIQUE: CT ABDOMEN PELVIS W IV CONTRAST. Radiation dose reduction was utilized (automated exposure control, mA or kV adjustment based on patient size, or iterative image reconstruction). 65 mL Isovue-370 IV Exam date/time: 11/06/2024 11:18 PM.  Comparison none.  FINDINGS: Lung bases: Clear.  Abdomen/pelvis: Spleen, bilateral adrenal glands, and pancreas are unremarkable. Gallbladder present. No biliary ductal dilatation. No liver lesions. No renal mass or hydronephrosis. Uterus present. Hemorrhagic cyst right ovary. Small hemoperitoneum in the pelvis no diverticulitis. Visualized portions of the appendix are unremarkable.    Impression:    IMPRESSION: 1. Likely ruptured hemorrhagic cyst right ovary with small hemoperitoneum. 2. Wall thickening distal stomach    ###CODE CRITICAL REPORT###  Automated notification pathway concerning the above report was activated at the time below.  ORDERING PROVIDER (Unless stated above may not be the provider contacted): LYNWOOD JAYSON LIFE TIME: 11/07/2024 1:02 AM  Electronically Signed by: Glendia Guillaume, MD on 11/07/2024 1:04 AM  US  PELVIS TRANSABDOMINAL AND TRANSVAGINAL WITH DOPPLER   Narrative:    Pelvic ultrasound.  HISTORY: Pelvic pain.  Real-time grayscale and color Doppler transabdominal transvaginal imaging of the pelvis was performed. Spectral analysis of the ovaries was performed  The uterus measures 6.5 x 4.5 x 4.9 cm. There is a 1.0 cm endometrial stripe.  The right ovary measures 5.4 x 3.6 x 4.5 cm and demonstrates arterial and venous flow. There is a 3.8 x 2.9 x 3.4 cm complex cyst in the right  ovary possibly hemorrhagic cyst.  The left ovary measures 2.7 x 1.7 x 2.2 cm and demonstrates arterial and venous flow.  There is complex fluid within the pelvis which could represent hemorrhage.    Impression:    IMPRESSION:  No evidence of ovarian  torsion.  3.8 x 2.9 x 3.4 cm complex lesion in the right ovary possibly hemorrhagic cyst for which a follow-up pelvic ultrasound in 4-6 weeks is recommended.  Complex fluid in the pelvis which may represent hemorrhage which was also identified on the patient's CT scan of 11/06/2024.  Unremarkable uterus.  Electronically Signed by: Marinda Fleming, MD on 11/07/2024 1:28 AM     ECG: ECG Results   None                                                                        Pre-Sedation Procedures    Medical Decision Making Presents with severe pelvis pain sudden onset this evening Workup revealed hemorrhagic ovarian cyst noted on CT and ultrasound  Discussed treatment supportive care Follow-up with GYN in 4 weeks for repeat ultrasound  Amount and/or Complexity of Data Reviewed Labs: ordered. Radiology: ordered.  Risk Prescription drug management.       In reviewing the patient's old records, I reviewed their prior controlled substances prescriptions, using the PDMP.  Provider Communication  New Prescriptions   HYDROCODONE-ACETAMINOPHEN  (NORCO) 5-325 MG PER TABLET    Take one tablet to two tablets by mouth every 4 (four) hours as needed for up to 3 days. Max Daily Amount: 12 tablets      Quantity: 12 tablet    Refills: 0    Modified Medications   No medications on file    Discontinued Medications   No medications on file    Clinical Impression Final diagnoses:  Hemorrhagic ovarian cyst    ED Disposition     ED Disposition  Discharge   Condition  Stable   Comment  --                   Electronically signed by:       [1] Social  History Tobacco Use  Smoking Status Not on file  Smokeless Tobacco Not on file  [2] No Known Allergies  Lynwood JAYSON Life, MD 11/07/24 0136

## 2024-11-25 ENCOUNTER — Ambulatory Visit: Payer: Self-pay | Admitting: Student

## 2024-11-25 ENCOUNTER — Encounter: Payer: Self-pay | Admitting: Student

## 2024-11-25 ENCOUNTER — Other Ambulatory Visit: Payer: Self-pay

## 2024-11-25 VITALS — BP 92/53 | HR 69 | Wt 135.0 lb

## 2024-11-25 DIAGNOSIS — N83209 Unspecified ovarian cyst, unspecified side: Secondary | ICD-10-CM | POA: Insufficient documentation

## 2024-11-25 DIAGNOSIS — Z3009 Encounter for other general counseling and advice on contraception: Secondary | ICD-10-CM | POA: Insufficient documentation

## 2024-11-25 DIAGNOSIS — B3731 Acute candidiasis of vulva and vagina: Secondary | ICD-10-CM | POA: Insufficient documentation

## 2024-11-25 MED ORDER — IBUPROFEN 600 MG PO TABS: 600.0000 mg | ORAL_TABLET | Freq: Four times a day (QID) | ORAL | 3 refills | Status: AC | PRN

## 2024-11-25 NOTE — Assessment & Plan Note (Signed)
-  Patient states it took 1 month to have

## 2024-11-25 NOTE — Progress Notes (Addendum)
 History   Gloria Orr is a 23 year old [id number] female presenting with lower abdominal pain following a diagnosis of hemorrhagic cyst from an appointment in the emergency room on 11/06/2024.  She is still complaining of pelvic pain, dyspareunia, dyschezia, and fertility issues.  Pelvic pain is provoked with movement and palpation with her estimating pain is 4/10.  Pain is worsened with intercourse with an 8/10 pain during intercourse.  Her period started 11/15 was a normal period for her but she was also taking narcotics at the time from the ER.  Fertility issues- patient was able to get pregnant in 1 month for her first born but is complaining that she is having unprotected intercourse on average 2-3x a week and has been unable to get pregnant since her IUD was taken out towards the end of 2024.  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  She also has a history of a yeast infection in August 2025 treated with OTC meds and another yeast infection in October 2025 treated by fluconazole at an urgent care. Denies symptoms of a yeast infection now.    Chief Complaint  Patient presents with   GYN Visit      Past Medical History:  Diagnosis Date   Chronic headaches    Depression with suicidal ideation    Eczema    Seborrheic keratosis of scalp    Sinusitis    Stress incontinence     Past Surgical History:  Procedure Laterality Date   NO PAST SURGERIES      Family History  Problem Relation Age of Onset   Asthma Mother    Hypertension Father    Diabetes Father    Heart disease Sister    Asthma Sister    Asthma Brother     Social History   Tobacco Use   Smoking status: Never   Smokeless tobacco: Never  Vaping Use   Vaping status: Never Used  Substance Use Topics   Alcohol use: Not Currently   Drug use: Never    Allergies: No Known Allergies  (Not in a hospital admission)    Review of Systems   Gastrointestinal:  Negative for blood in stool.  Genitourinary:  Negative for dysuria and flank pain.       Denies itching, burning when urinating. Reports dyspareunia 8/10 and positional abdominal pain that is 4/10     Physical Exam Blood pressure (!) 92/53, pulse 69, weight 135 lb (61.2 kg), last menstrual period 11/09/2024. Physical Exam Constitutional:      Appearance: Normal appearance.  Eyes:     Pupils: Pupils are equal, round, and reactive to light.  Pulmonary:     Effort: No respiratory distress.  Neurological:     Mental Status: She is alert.  Psychiatric:        Mood and Affect: Mood normal.        Behavior: Behavior normal.     11/12 Transvaginal U/S with doppler: US  PELVIS TRANSABDOMINAL AND TRANSVAGINAL WITH DOPPLER    Narrative:     Pelvic ultrasound.   HISTORY: Pelvic pain.   Real-time grayscale and color Doppler transabdominal transvaginal imaging of the pelvis was performed. Spectral analysis of the ovaries was performed   The uterus measures 6.5 x 4.5 x 4.9 cm. There is a 1.0 cm endometrial stripe.   The right ovary measures 5.4 x 3.6 x 4.5 cm and demonstrates arterial and venous flow.  There is a 3.8 x 2.9 x 3.4 cm complex cyst in the right ovary possibly hemorrhagic cyst.   The left ovary measures 2.7 x 1.7 x 2.2 cm and demonstrates arterial and venous flow.   There is complex fluid within the pelvis which could represent hemorrhage.      Impression:     IMPRESSION:   No evidence of ovarian torsion.   3.8 x 2.9 x 3.4 cm complex lesion in the right ovary possibly hemorrhagic cyst for which a follow-up pelvic ultrasound in 4-6 weeks is recommended.   Complex fluid in the pelvis which may represent hemorrhage which was also identified on the patient's CT scan of 11/06/2024.   Unremarkable uterus.   Electronically Signed by: Marinda Fleming, MD on 11/07/2024 1:28 AM    11/12 ER CT Scan Impression: CT ABDOMEN PELVIS W IV CONTRAST    Narrative:      INDICATION: Abdominal Pain   TECHNIQUE: CT ABDOMEN PELVIS W IV CONTRAST. Radiation dose reduction was utilized (automated exposure control, mA or kV adjustment based on patient size, or iterative image reconstruction). 65 mL Isovue-370 IV Exam date/time: 11/06/2024 11:18 PM.  Comparison none.   FINDINGS: Lung bases: Clear.   Abdomen/pelvis: Spleen, bilateral adrenal glands, and pancreas are unremarkable. Gallbladder present. No biliary ductal dilatation. No liver lesions. No renal mass or hydronephrosis. Uterus present. Hemorrhagic cyst right ovary. Small hemoperitoneum in the pelvis no diverticulitis. Visualized portions of the appendix are unremarkable.      Impression:     IMPRESSION: 1. Likely ruptured hemorrhagic cyst right ovary with small hemoperitoneum. 2. Wall thickening distal stomach      Assessment and Plan:  1. Hemorrhagic ovarian cyst (Primary) - US  PELVIC COMPLETE WITH TRANSVAGINAL; Future - ibuprofen  (ADVIL ) 600 MG tablet; Take 1 tablet (600 mg total) by mouth every 6 (six) hours as needed.  Dispense: 60 tablet; Refill: 3 - Follow up with Dr. Jeralyn if pain persists  2. Family planning counseling -Pt's insurance ran out yesterday so she will figure out insurance before meeting with possible fertility specialist.  3. Yeast infection of the vagina -Counselled on soaps and importance of using sensitive products.   Logan Chary, Student- PA     Attestation of Supervision of Student:  I confirm that I have verified the information documented in the physician assistant student's note and that I have also personally performed the history, physical exam and all medical decision making activities.  I have verified that all services and findings are accurately documented in this student's note; and I agree with management and plan as outlined in the documentation. I have also made any necessary editorial changes.  Labs & STI testing in ED normal.  Pap smear up to date.   Discussed possible causes & management of symptoms. Offered medications, migs referral, f/u imaging, or expectant management. Patient prefers f/u imaging & NSAIDs. Will reach out for appt with Dr. Jeralyn if symptoms worsen or don't improve.   Rocky Satterfield, NP [facility] [employer], [facility] 11/25/2024 5:50 PM

## 2024-11-25 NOTE — Progress Notes (Signed)
 11/06/2024: [facility]- US  Pelvis Transabdominal  MPRESSION:  No evidence of ovarian torsion.  3.8 x 2.9 x 3.4 cm complex lesion in the right ovary possibly hemorrhagic cyst for which a follow-up pelvic ultrasound in 4-6 weeks is recommended.  Complex fluid in the pelvis which may represent hemorrhage which was also identified on the patient's CT scan of 11/06/2024.  Unremarkable uterus.  Electronically Signed by: Marinda Fleming, MD on 11/07/2024 1:28 AM   ECG: ECG Results  None

## 2024-11-25 NOTE — Assessment & Plan Note (Signed)
-  Diagnosed in ER on 11/12, they gave her pain meds and told to follow up imaging in 4-6 weeks. - Pt is still having 4/10 pelvic pain and 8/10 pain with intercourse as of 11/25/2024

## 2025-01-09 ENCOUNTER — Ambulatory Visit (HOSPITAL_COMMUNITY)
Admission: RE | Admit: 2025-01-09 | Discharge: 2025-01-09 | Disposition: A | Discharge status: Home / Self Care | Source: Ambulatory Visit | Attending: Student | Admitting: Student

## 2025-01-09 DIAGNOSIS — N83209 Unspecified ovarian cyst, unspecified side: Secondary | ICD-10-CM | POA: Insufficient documentation

## 2025-01-10 ENCOUNTER — Encounter: Payer: Self-pay | Admitting: Student

## 2025-01-13 ENCOUNTER — Ambulatory Visit (HOSPITAL_COMMUNITY): Payer: Self-pay | Admitting: Student

## 2025-01-14 ENCOUNTER — Telehealth: Payer: Self-pay | Admitting: *Deleted

## 2025-01-14 NOTE — Telephone Encounter (Signed)
 Returned call from 10:20 AM. Left patient a detailed message.

## 2025-01-16 ENCOUNTER — Telehealth: Payer: Self-pay | Admitting: *Deleted

## 2025-01-17 NOTE — Telephone Encounter (Signed)
 Error

## 2025-02-03 ENCOUNTER — Encounter: Admitting: Obstetrics and Gynecology

## 2025-02-03 ENCOUNTER — Encounter
